# Patient Record
Sex: Male | Born: 1975 | Race: Black or African American | Hispanic: No | Marital: Married | Smoking: Current every day smoker
Health system: Southern US, Community
[De-identification: ages and names within clinical notes are randomized; demographics above are authoritative.]

## PROBLEM LIST (undated history)

## (undated) ENCOUNTER — Emergency Department (HOSPITAL_COMMUNITY): Discharge status: Against Medical Advice | Payer: Self-pay

## (undated) DIAGNOSIS — S79911A Unspecified injury of right hip, initial encounter: Secondary | ICD-10-CM

---

## 2000-04-09 ENCOUNTER — Emergency Department (HOSPITAL_COMMUNITY): Admission: EM | Admit: 2000-04-09 | Discharge: 2000-04-09 | Payer: Self-pay | Admitting: Emergency Medicine

## 2000-08-03 ENCOUNTER — Emergency Department (HOSPITAL_COMMUNITY): Admission: EM | Admit: 2000-08-03 | Discharge: 2000-08-03 | Payer: Self-pay

## 2000-12-01 ENCOUNTER — Emergency Department (HOSPITAL_COMMUNITY): Admission: EM | Admit: 2000-12-01 | Discharge: 2000-12-01 | Payer: Self-pay | Admitting: Emergency Medicine

## 2001-07-23 ENCOUNTER — Emergency Department (HOSPITAL_COMMUNITY): Admission: EM | Admit: 2001-07-23 | Discharge: 2001-07-23 | Payer: Self-pay | Admitting: Emergency Medicine

## 2001-07-23 ENCOUNTER — Encounter: Payer: Self-pay | Admitting: Emergency Medicine

## 2004-01-17 ENCOUNTER — Emergency Department (HOSPITAL_COMMUNITY): Admission: EM | Admit: 2004-01-17 | Discharge: 2004-01-17 | Payer: Self-pay | Admitting: Emergency Medicine

## 2004-09-01 ENCOUNTER — Emergency Department (HOSPITAL_COMMUNITY): Admission: EM | Admit: 2004-09-01 | Discharge: 2004-09-01 | Payer: Self-pay | Admitting: Emergency Medicine

## 2005-03-07 ENCOUNTER — Emergency Department (HOSPITAL_COMMUNITY): Admission: EM | Admit: 2005-03-07 | Discharge: 2005-03-07 | Payer: Self-pay | Admitting: Emergency Medicine

## 2007-02-15 ENCOUNTER — Emergency Department (HOSPITAL_COMMUNITY): Admission: EM | Admit: 2007-02-15 | Discharge: 2007-02-15 | Payer: Self-pay | Admitting: Emergency Medicine

## 2010-04-28 ENCOUNTER — Emergency Department (HOSPITAL_COMMUNITY): Admission: EM | Admit: 2010-04-28 | Discharge: 2010-04-28 | Payer: Self-pay | Admitting: Emergency Medicine

## 2010-11-11 ENCOUNTER — Emergency Department (HOSPITAL_COMMUNITY)
Admission: EM | Admit: 2010-11-11 | Discharge: 2010-11-12 | Payer: Self-pay | Source: Home / Self Care | Admitting: Emergency Medicine

## 2011-12-18 ENCOUNTER — Emergency Department (HOSPITAL_COMMUNITY)
Admission: EM | Admit: 2011-12-18 | Discharge: 2011-12-18 | Disposition: A | Discharge status: Home / Self Care | Payer: Self-pay | Attending: Emergency Medicine | Admitting: Emergency Medicine

## 2011-12-18 ENCOUNTER — Encounter (HOSPITAL_COMMUNITY): Payer: Self-pay | Admitting: *Deleted

## 2011-12-18 ENCOUNTER — Emergency Department (HOSPITAL_COMMUNITY): Payer: Self-pay

## 2011-12-18 DIAGNOSIS — W010XXA Fall on same level from slipping, tripping and stumbling without subsequent striking against object, initial encounter: Secondary | ICD-10-CM | POA: Insufficient documentation

## 2011-12-18 DIAGNOSIS — E876 Hypokalemia: Secondary | ICD-10-CM | POA: Insufficient documentation

## 2011-12-18 DIAGNOSIS — F17200 Nicotine dependence, unspecified, uncomplicated: Secondary | ICD-10-CM | POA: Insufficient documentation

## 2011-12-18 DIAGNOSIS — M7989 Other specified soft tissue disorders: Secondary | ICD-10-CM | POA: Insufficient documentation

## 2011-12-18 DIAGNOSIS — R609 Edema, unspecified: Secondary | ICD-10-CM | POA: Insufficient documentation

## 2011-12-18 DIAGNOSIS — IMO0002 Reserved for concepts with insufficient information to code with codable children: Secondary | ICD-10-CM | POA: Insufficient documentation

## 2011-12-18 DIAGNOSIS — S7010XA Contusion of unspecified thigh, initial encounter: Secondary | ICD-10-CM | POA: Insufficient documentation

## 2011-12-18 DIAGNOSIS — M79609 Pain in unspecified limb: Secondary | ICD-10-CM | POA: Insufficient documentation

## 2011-12-18 LAB — DIFFERENTIAL
Eosinophils Relative: 1 % (ref 0–5)
Lymphocytes Relative: 38 % (ref 12–46)
Lymphs Abs: 2.8 10*3/uL (ref 0.7–4.0)
Monocytes Absolute: 0.6 10*3/uL (ref 0.1–1.0)

## 2011-12-18 LAB — CBC
HCT: 36.2 % — ABNORMAL LOW (ref 39.0–52.0)
Hemoglobin: 12.6 g/dL — ABNORMAL LOW (ref 13.0–17.0)
MCV: 91.9 fL (ref 78.0–100.0)
Platelets: 133 10*3/uL — ABNORMAL LOW (ref 150–400)
RBC: 3.94 MIL/uL — ABNORMAL LOW (ref 4.22–5.81)
WBC: 7.4 10*3/uL (ref 4.0–10.5)

## 2011-12-18 LAB — BASIC METABOLIC PANEL
CO2: 25 mEq/L (ref 19–32)
Calcium: 9.7 mg/dL (ref 8.4–10.5)
Glucose, Bld: 131 mg/dL — ABNORMAL HIGH (ref 70–99)
Sodium: 140 mEq/L (ref 135–145)

## 2011-12-18 MED ORDER — FENTANYL CITRATE 0.05 MG/ML IJ SOLN
50.0000 ug | Freq: Once | INTRAMUSCULAR | Status: AC
  Administered 2011-12-18: 50 ug via INTRAVENOUS
  Filled 2011-12-18: qty 2

## 2011-12-18 MED ORDER — SODIUM CHLORIDE 0.9 % IV BOLUS (SEPSIS)
1000.0000 mL | Freq: Once | INTRAVENOUS | Status: AC
  Administered 2011-12-18: 1000 mL via INTRAVENOUS

## 2011-12-18 MED ORDER — POTASSIUM CHLORIDE CRYS ER 20 MEQ PO TBCR
40.0000 meq | EXTENDED_RELEASE_TABLET | Freq: Once | ORAL | Status: AC
  Administered 2011-12-18: 40 meq via ORAL
  Filled 2011-12-18: qty 2

## 2011-12-18 MED ORDER — OXYCODONE-ACETAMINOPHEN 5-325 MG PO TABS: ORAL_TABLET | ORAL | Status: AC

## 2011-12-18 MED ORDER — ONDANSETRON HCL 4 MG/2ML IJ SOLN
4.0000 mg | Freq: Once | INTRAMUSCULAR | Status: AC
  Administered 2011-12-18: 4 mg via INTRAVENOUS
  Filled 2011-12-18: qty 2

## 2011-12-18 MED ORDER — POTASSIUM CHLORIDE ER 10 MEQ PO TBCR: 10.0000 meq | EXTENDED_RELEASE_TABLET | Freq: Two times a day (BID) | ORAL | Status: AC

## 2011-12-18 NOTE — ED Notes (Signed)
Pt states "was going to move to some stuff in a storage building, slipped & fell, hit my leg on the concrete"; pt presents with hematoma & abrasion to left lateral thigh

## 2011-12-18 NOTE — ED Provider Notes (Signed)
History     CSN: [id number]  Arrival date & time 12/18/11  1609   First MD Initiated Contact with Patient 12/18/11 1904      Chief Complaint  Patient presents with  . Leg Injury    (Consider location/radiation/quality/duration/timing/severity/associated sxs/prior treatment) HPI  Patient presents to emergency department complaining of left lateral upper thigh injury and pain that began earlier today. Patient states that he was standing on a concrete floor and slipped from a standing position causing him to fall plan on the lateral aspect of his left upper thigh. Patient states that he had some immediate pain but as the day progressed had increasing swelling and bruising as well as pain over the area of his thigh that he landed on. Patient denies hitting head, loss of consciousness, or additional injury. Patient states that angulate he has become more painful as the swelling has progressed. Patient states he has no known medical problems and takes no medicine on regular basis. He specifically denies any personal history of bleeding or clotting disorders in any known family history of bleeding or clotting disorders. Patient will use an occasional BC headache powder as needed but takes no daily anticoagulants. Patient denies any numbness or tingling in his left lower extremity. Pain is aggravated by touch and movement of his thigh. Patient has not taken anything for pain prior to arrival. Pain is constant with pain increasing with aggravation. Patient states he has been awake since 4 AM which is the time that he has to wake up for work and has not eaten very much today and states the increasing pain is causing him to feel nauseous and lightheaded.  History reviewed. No pertinent past medical history.  History reviewed. No pertinent past surgical history.  No family history on file.  History  Substance Use Topics  . Smoking status: Current Everyday Smoker -- 0.5 packs/day  . Smokeless tobacco:  Not on file  . Alcohol Use: 3.6 oz/week    6 Cans of beer per week      Review of Systems  All other systems reviewed and are negative.    Allergies  Review of patient's allergies indicates no known allergies.  Home Medications   Current Outpatient Rx  Name Route Sig Dispense Refill  . BC HEADACHE POWDER PO Oral Take 1 packet by mouth 2 (two) times daily as needed. For pain.      Ht 6' (1.829 m)  Wt 185 lb (83.915 kg)  BMI 25.09 kg/m2  Physical Exam  Nursing note and vitals reviewed. Constitutional: He is oriented to person, place, and time. He appears well-developed and well-nourished. No distress.  HENT:  Head: Normocephalic and atraumatic.  Eyes: Conjunctivae and EOM are normal. Pupils are equal, round, and reactive to light.  Neck: Normal range of motion. Neck supple.  Cardiovascular: Normal rate, regular rhythm, normal heart sounds and intact distal pulses.  Exam reveals no gallop and no friction rub.   No murmur heard. Pulmonary/Chest: Effort normal and breath sounds normal. No respiratory distress. He has no wheezes. He has no rales. He exhibits no tenderness.  Abdominal: Soft. Bowel sounds are normal. He exhibits no distension and no mass. There is no tenderness. There is no rebound and no guarding.  Musculoskeletal: Normal range of motion. He exhibits edema and tenderness.       Large hematoma of left lateral upper thigh with bruising and firmness. Moderate tenderness to palpation. Pelvis is stable and nontender. Bilateral knees and ankles are nontender with  full range of motion. Pain is thigh with range of motion of left knee. Abrasion over hematoma but hemostatic.  Neurological: He is alert and oriented to person, place, and time.  Skin: Skin is warm and dry. No rash noted. He is not diaphoretic. No erythema.  Psychiatric: He has a normal mood and affect.    ED Course  Procedures (including critical care time)  IV fluids, IV fentanyl, and IV  Zofran.  Patient states he feels much better after medications.   Labs Reviewed  CBC - Abnormal; Notable for the following:    RBC 3.94 (*)    Hemoglobin 12.6 (*)    HCT 36.2 (*)    Platelets 133 (*)    All other components within normal limits  BASIC METABOLIC PANEL - Abnormal; Notable for the following:    Potassium 2.9 (*)    Glucose, Bld 131 (*)    All other components within normal limits  DIFFERENTIAL   Dg Femur Left  12/18/2011  *RADIOLOGY REPORT*  Clinical Data: Larey Seat, pain and swelling.  LEFT FEMUR - 2 VIEW  Comparison: None.  Findings: There is marked soft tissue swelling lateral to the mid shaft of the femur. Negative for fracture, dislocation, or other acute abnormality.  Normal alignment and mineralization. No significant degenerative change.  IMPRESSION:  Lateral soft tissue swelling without bony abnormality.  Original Report Authenticated By: Thora Lance III, M.D.     1. Hematoma of thigh   2. Hypokalemia       MDM  Given the size of the large hematoma we have requested that the patient return on Sunday for recheck. Baseline labs were drawn so that a baseline hemoglobin can be established to compare at follow up if needed. Patient's pain has improved with IV pain medication. His left lower extremity is neurovascularly intact. Patient denies any history of bleeding or clotting disorders and takes no anticoagulants a regular basis. Patient denies additional injury. Patient is agreeable to returning on Sunday but returning sooner changing or worsening symptoms.        Jenness Corner, [place] 12/19/11 [phone]

## 2011-12-18 NOTE — ED Notes (Signed)
Per EMS pt fell in garage and has hematoma left lateral leg. Pt able to ambulate. No LOC or head injury.

## 2011-12-18 NOTE — ED Notes (Addendum)
Meds given as ordered--Rates pain prior to meds administration as a 9 on 1-10 scale.  Labs collected and sent to lab

## 2011-12-19 NOTE — ED Provider Notes (Signed)
Medical screening examination/treatment/procedure(s) were performed by non-physician practitioner and as supervising physician I was immediately available for consultation/collaboration.   Nehal Witting A. Krystiana Fornes, MD 12/19/11 0932 

## 2011-12-21 ENCOUNTER — Emergency Department (HOSPITAL_COMMUNITY)
Admission: EM | Admit: 2011-12-21 | Discharge: 2011-12-21 | Disposition: A | Discharge status: Home / Self Care | Payer: Self-pay | Attending: Emergency Medicine | Admitting: Emergency Medicine

## 2011-12-21 ENCOUNTER — Encounter (HOSPITAL_COMMUNITY): Payer: Self-pay | Admitting: Emergency Medicine

## 2011-12-21 DIAGNOSIS — S7010XA Contusion of unspecified thigh, initial encounter: Secondary | ICD-10-CM | POA: Insufficient documentation

## 2011-12-21 DIAGNOSIS — W11XXXA Fall on and from ladder, initial encounter: Secondary | ICD-10-CM | POA: Insufficient documentation

## 2011-12-21 DIAGNOSIS — Y92009 Unspecified place in unspecified non-institutional (private) residence as the place of occurrence of the external cause: Secondary | ICD-10-CM | POA: Insufficient documentation

## 2011-12-21 DIAGNOSIS — M7989 Other specified soft tissue disorders: Secondary | ICD-10-CM | POA: Insufficient documentation

## 2011-12-21 DIAGNOSIS — R609 Edema, unspecified: Secondary | ICD-10-CM | POA: Insufficient documentation

## 2011-12-21 DIAGNOSIS — F17200 Nicotine dependence, unspecified, uncomplicated: Secondary | ICD-10-CM | POA: Insufficient documentation

## 2011-12-21 DIAGNOSIS — M79609 Pain in unspecified limb: Secondary | ICD-10-CM | POA: Insufficient documentation

## 2011-12-21 MED ORDER — OXYCODONE-ACETAMINOPHEN 5-325 MG PO TABS: 1.0000 | ORAL_TABLET | ORAL | Status: AC | PRN

## 2011-12-21 MED ORDER — IBUPROFEN 800 MG PO TABS: 800.0000 mg | ORAL_TABLET | Freq: Three times a day (TID) | ORAL | Status: AC

## 2011-12-21 NOTE — ED Notes (Signed)
Large raised, bruised area on outer l/thigh, bruising on inner l/thigh

## 2011-12-21 NOTE — ED Provider Notes (Signed)
History     CSN: [id number]  Arrival date & time 12/21/11  1455   First MD Initiated Contact with Patient 12/21/11 1525     3:56 PM HPI Patient reports he was seen here on Thursday after falling off of a ladder. Reports a left thigh injury. Was advised to return to the ED today for a recheck. Reports no improvement or worsening of the pain. Denies numbness, tingling or weakness. Denies CP or SOB. Requests an extended work note.  Patient is a 36 y.o. male presenting with leg pain. The history is provided by the patient.  Leg Pain  The incident occurred more than 2 days ago. The incident occurred at home. The injury mechanism was a fall. The pain is present in the left thigh. The quality of the pain is described as aching. The pain is severe. The pain has been constant since onset. Pertinent negatives include no numbness, no inability to bear weight, no loss of motion, no muscle weakness, no loss of sensation and no tingling. He reports no foreign bodies present. The symptoms are aggravated by activity, bearing weight and palpation. He has tried elevation, ice and rest (Percocet) for the symptoms. The treatment provided moderate relief.    History reviewed. No pertinent past medical history.  History reviewed. No pertinent past surgical history.  History reviewed. No pertinent family history.  History  Substance Use Topics  . Smoking status: Current Everyday Smoker -- 0.5 packs/day  . Smokeless tobacco: Not on file  . Alcohol Use: 3.6 oz/week    6 Cans of beer per week      Review of Systems  Constitutional: Negative for fever and chills.  Respiratory: Negative for shortness of breath.   Cardiovascular: Negative for chest pain.  Gastrointestinal: Negative for nausea and vomiting.  Musculoskeletal:       Leg pain  Skin:       Hematoma  Neurological: Negative for tingling and numbness.    Allergies  Review of patient's allergies indicates no known allergies.  Home  Medications   Current Outpatient Rx  Name Route Sig Dispense Refill  . BC HEADACHE POWDER PO Oral Take 1 packet by mouth 2 (two) times daily as needed. For pain.    . OXYCODONE-ACETAMINOPHEN 5-325 MG PO TABS  Take 1-2 tabs every 4 hours as needed for pain. 15 tablet 0  . POTASSIUM CHLORIDE ER 10 MEQ PO TBCR Oral Take 1 tablet (10 mEq total) by mouth 2 (two) times daily. 30 tablet 0    BP 126/60  Pulse 92  Temp(Src) 98 F (36.7 C) (Oral)  Resp 18  SpO2 100%  Physical Exam  Constitutional: He is oriented to person, place, and time. He appears well-developed and well-nourished.  HENT:  Head: Normocephalic and atraumatic.  Eyes: Pupils are equal, round, and reactive to light.  Musculoskeletal:       Left upper leg: He exhibits tenderness, swelling and edema.       Legs: Neurological: He is alert and oriented to person, place, and time.  Skin: Skin is warm and dry. No rash noted. No erythema. No pallor.  Psychiatric: He has a normal mood and affect. His behavior is normal.    ED Course  Procedures   MDM   Advised patient to return immediately for CP, SOB, fever or worsening pain. Recommended warm compresses , rest and elevation. Will give another Rx for percocet and a new Rx for ibuprofen. Patient agrees with plan and is ready for discharge.  Thomasene Lot, PA-C 12/21/11 1609

## 2011-12-22 NOTE — ED Provider Notes (Signed)
Medical screening examination/treatment/procedure(s) were performed by non-physician practitioner and as supervising physician I was immediately available for consultation/collaboration.   Mettie Roylance, MD 12/22/11 0647 

## 2012-01-10 ENCOUNTER — Emergency Department (HOSPITAL_COMMUNITY)
Admission: EM | Admit: 2012-01-10 | Discharge: 2012-01-10 | Disposition: A | Discharge status: Home / Self Care | Payer: Self-pay | Attending: Emergency Medicine | Admitting: Emergency Medicine

## 2012-01-10 ENCOUNTER — Emergency Department (HOSPITAL_COMMUNITY): Payer: Self-pay

## 2012-01-10 ENCOUNTER — Encounter (HOSPITAL_COMMUNITY): Payer: Self-pay | Admitting: *Deleted

## 2012-01-10 DIAGNOSIS — S7010XA Contusion of unspecified thigh, initial encounter: Secondary | ICD-10-CM | POA: Insufficient documentation

## 2012-01-10 DIAGNOSIS — M25559 Pain in unspecified hip: Secondary | ICD-10-CM | POA: Insufficient documentation

## 2012-01-10 DIAGNOSIS — S8010XA Contusion of unspecified lower leg, initial encounter: Secondary | ICD-10-CM

## 2012-01-10 DIAGNOSIS — W2209XA Striking against other stationary object, initial encounter: Secondary | ICD-10-CM | POA: Insufficient documentation

## 2012-01-10 HISTORY — DX: Unspecified injury of right hip, initial encounter: S79.911A

## 2012-01-10 LAB — CBC
MCH: 33 pg (ref 26.0–34.0)
MCV: 94.9 fL (ref 78.0–100.0)
Platelets: 223 10*3/uL (ref 150–400)
RBC: 4.33 MIL/uL (ref 4.22–5.81)

## 2012-01-10 LAB — POCT I-STAT, CHEM 8
BUN: 8 mg/dL (ref 6–23)
Chloride: 108 mEq/L (ref 96–112)
HCT: 45 % (ref 39.0–52.0)
Potassium: 4.1 mEq/L (ref 3.5–5.1)
Sodium: 143 mEq/L (ref 135–145)

## 2012-01-10 LAB — DIFFERENTIAL
Eosinophils Absolute: 0.1 10*3/uL (ref 0.0–0.7)
Eosinophils Relative: 2 % (ref 0–5)
Lymphs Abs: 2.4 10*3/uL (ref 0.7–4.0)
Monocytes Absolute: 0.5 10*3/uL (ref 0.1–1.0)
Monocytes Relative: 8 % (ref 3–12)

## 2012-01-10 MED ORDER — OXYCODONE-ACETAMINOPHEN 5-325 MG PO TABS: 1.0000 | ORAL_TABLET | ORAL | Status: AC | PRN

## 2012-01-10 NOTE — ED Provider Notes (Signed)
History     CSN: [id number]  Arrival date & time 01/10/12  1649   First MD Initiated Contact with Patient 01/10/12 1721      Chief Complaint  Patient presents with  . Hip Pain    bleeding    (Consider location/radiation/quality/duration/timing/severity/associated sxs/prior treatment) HPI Comments: Patient reports that he fell off of a ladder on 12/18/11.  Patient had negative xrays done at that time.  He had a large hematoma of his left thigh and pain after the injury.  He reports that the swelling has improved.  However, he continues to have significant pain and swelling of that area.  Today he hit this area on the corner of a table and has been having some bleeding from the area. Bleeding controlled at this time. He reports that the area was scabbed over.  He denies any numbness or tingling.  Denies any fever or chills.He denies any prior bleeding disorders.  He reports that he is not on any anticoagulation therapy.  Patient is a 36 y.o. male presenting with hip pain. The history is provided by the patient.  Hip Pain Pertinent negatives include no chills, fever, nausea, numbness or vomiting.    Past Medical History  Diagnosis Date  . Injury of right hip     History reviewed. No pertinent past surgical history.  History reviewed. No pertinent family history.  History  Substance Use Topics  . Smoking status: Current Everyday Smoker -- 0.5 packs/day    Types: Cigarettes  . Smokeless tobacco: Never Used  . Alcohol Use: 3.6 oz/week    6 Cans of beer per week      Review of Systems  Constitutional: Negative for fever and chills.  Gastrointestinal: Negative for nausea and vomiting.  Musculoskeletal: Negative for gait problem.  Skin: Positive for wound.  Neurological: Negative for dizziness, syncope, light-headedness and numbness.    Allergies  Review of patient's allergies indicates no known allergies.  Home Medications   Current Outpatient Rx  Name Route Sig Dispense  Refill  . OXYCODONE-ACETAMINOPHEN 5-325 MG PO TABS Oral Take 1 tablet by mouth every 4 (four) hours as needed. For pain relief    . POTASSIUM CHLORIDE ER 10 MEQ PO TBCR Oral Take 1 tablet (10 mEq total) by mouth 2 (two) times daily. 30 tablet 0    BP 145/95  Pulse 94  Temp(Src) 98.6 F (37 C) (Oral)  Resp 18  Wt 186 lb (84.369 kg)  SpO2 98%  Physical Exam  Nursing note and vitals reviewed. Constitutional: He is oriented to person, place, and time. He appears well-developed and well-nourished. No distress.  HENT:  Head: Normocephalic and atraumatic.  Neck: Normal range of motion. Neck supple.  Cardiovascular: Normal rate, regular rhythm and normal heart sounds.   Pulmonary/Chest: Effort normal and breath sounds normal.  Musculoskeletal: Normal range of motion.       Large 5-6 cm in diameter hematoma of the left lateral thigh.  Area scabbed in the center.  Area not actively bleeding.  Mild tenderness to palpation. Full ROM of left hip, knee, and ankle. Patient able to ambulate without difficulty. Dorsal pedis pulse 2+ bilaterally. Distal sensation intact.   Neurological: He is alert and oriented to person, place, and time.  Skin: He is not diaphoretic.     Psychiatric: He has a normal mood and affect.    ED Course  Procedures (including critical care time)  Labs Reviewed - No data to display No results found.   No  diagnosis found.  Patient discussed with Dr. Manus Gunning who also evaluated patient.  MDM  Patient with large hematoma of the thigh.  CT scan was ordered of the hip, which was negative for fracture.  Patient neurovascularly intact.  Patient instructed to follow up with Orthopedics.          Pascal [place], PA-C 01/11/12 [phone]

## 2012-01-10 NOTE — ED Notes (Signed)
MD at bedside. 

## 2012-01-10 NOTE — ED Notes (Signed)
Patient returned from CT

## 2012-01-10 NOTE — ED Notes (Signed)
Pt from home with reports of hitting corner of a table with right hip that is injured after fall from ladder on 12/18/11. Pt reports area began to bleed again with increase in pain, area already swollen from previous injury, pt reports xray was done and was negative.

## 2012-01-10 NOTE — Discharge Instructions (Signed)
Hematoma You have a hematoma. This is an accumulation of blood usually caused by a blunt injury with a deep tissue bruise. Hematomas may form in large contusions and stay painful and swollen for weeks. Hematomas are usually reabsorbed by the body naturally, although at times they need to be drained. Initial treatment of contusions and hematomas includes:  Rest the injured area until the pain and swelling are better.   Apply ice packs for 20-30 minutes every 3-4 hours for the first 2-3 days.   Keep the injury elevated as much as possible to help reduce swelling and pain.   Compression bandages may also be used to assist with swelling and help restrict motion.  Most hematomas do not cause complications. They improve over several weeks as the blood is broken down and removed from the tissues. The area of injury may look bruised for several weeks. Reduce your activities until your injury is improved. Hematomas sometimes become infected.  SEEK MEDICAL CARE IF:   Your injury shows signs of infection with increased redness, swelling, or pain.   Your limb becomes cold, numb, or blue if the hematoma is in an upper or lower extremity.   You develop an unexplained fever.   You have any other questions or concerns.  Document Released: 12/04/2004 Document Revised: 06/25/2011 Document Reviewed: 10/27/2005 [facility] Patient Information 2012 [place], [place].  Your pain medication contains Tylenol.  Do not take additional Tylenol with this medication.  Do not drive or operate heavy machinery while taking this medication.

## 2012-01-10 NOTE — ED Notes (Signed)
PA at bedside.

## 2012-01-10 NOTE — ED Notes (Signed)
Patient transported to CT 

## 2012-01-11 NOTE — ED Provider Notes (Signed)
Medical screening examination/treatment/procedure(s) were conducted as a shared visit with non-physician practitioner(s) and myself.  I personally evaluated the patient during the encounter  L lateral thigh hematoma since fall on 2/7.  Scant bleeding from minor trauma today.  NVI. Large lateral thigh hematoma, hemostatic.  Glynn Octave, MD 01/11/12 [phone]

## 2012-01-19 ENCOUNTER — Encounter (HOSPITAL_COMMUNITY): Payer: Self-pay | Admitting: Emergency Medicine

## 2012-01-19 ENCOUNTER — Emergency Department (HOSPITAL_COMMUNITY)
Admission: EM | Admit: 2012-01-19 | Discharge: 2012-01-19 | Disposition: A | Discharge status: Home / Self Care | Payer: Self-pay | Attending: Emergency Medicine | Admitting: Emergency Medicine

## 2012-01-19 ENCOUNTER — Emergency Department (HOSPITAL_COMMUNITY): Payer: Self-pay

## 2012-01-19 DIAGNOSIS — S71009A Unspecified open wound, unspecified hip, initial encounter: Secondary | ICD-10-CM | POA: Insufficient documentation

## 2012-01-19 DIAGNOSIS — W11XXXA Fall on and from ladder, initial encounter: Secondary | ICD-10-CM | POA: Insufficient documentation

## 2012-01-19 DIAGNOSIS — M79609 Pain in unspecified limb: Secondary | ICD-10-CM | POA: Insufficient documentation

## 2012-01-19 DIAGNOSIS — F17200 Nicotine dependence, unspecified, uncomplicated: Secondary | ICD-10-CM | POA: Insufficient documentation

## 2012-01-19 DIAGNOSIS — S71109A Unspecified open wound, unspecified thigh, initial encounter: Secondary | ICD-10-CM

## 2012-01-19 LAB — CBC
HCT: 36.7 % — ABNORMAL LOW (ref 39.0–52.0)
Hemoglobin: 12.6 g/dL — ABNORMAL LOW (ref 13.0–17.0)
MCV: 95.3 fL (ref 78.0–100.0)
RBC: 3.85 MIL/uL — ABNORMAL LOW (ref 4.22–5.81)
RDW: 14.3 % (ref 11.5–15.5)
WBC: 7.6 10*3/uL (ref 4.0–10.5)

## 2012-01-19 LAB — DIFFERENTIAL
Basophils Absolute: 0.1 10*3/uL (ref 0.0–0.1)
Basophils Relative: 1 % (ref 0–1)
Eosinophils Relative: 2 % (ref 0–5)
Lymphocytes Relative: 34 % (ref 12–46)
Monocytes Relative: 11 % (ref 3–12)
Neutro Abs: 3.9 10*3/uL (ref 1.7–7.7)

## 2012-01-19 LAB — BASIC METABOLIC PANEL
BUN: 8 mg/dL (ref 6–23)
CO2: 26 mEq/L (ref 19–32)
Chloride: 100 mEq/L (ref 96–112)
GFR calc Af Amer: >90 mL/min (ref >90)
Potassium: 3.8 mEq/L (ref 3.5–5.1)

## 2012-01-19 MED ORDER — SODIUM CHLORIDE 0.9 % IV BOLUS (SEPSIS)
1000.0000 mL | Freq: Once | INTRAVENOUS | Status: AC
  Administered 2012-01-19: 1000 mL via INTRAVENOUS

## 2012-01-19 MED ORDER — ONDANSETRON HCL 4 MG/2ML IJ SOLN
4.0000 mg | Freq: Once | INTRAMUSCULAR | Status: AC
Start: 2012-01-19 — End: 2012-01-19
  Administered 2012-01-19: 4 mg via INTRAVENOUS
  Filled 2012-01-19: qty 2

## 2012-01-19 MED ORDER — VANCOMYCIN HCL IN DEXTROSE 1-5 GM/200ML-% IV SOLN
1000.0000 mg | Freq: Once | INTRAVENOUS | Status: AC
  Administered 2012-01-19: 1000 mg via INTRAVENOUS
  Filled 2012-01-19: qty 200

## 2012-01-19 MED ORDER — PIPERACILLIN-TAZOBACTAM 3.375 G IVPB
3.3750 g | Freq: Once | INTRAVENOUS | Status: AC
  Administered 2012-01-19: 3.375 g via INTRAVENOUS
  Filled 2012-01-19: qty 50

## 2012-01-19 MED ORDER — OXYCODONE-ACETAMINOPHEN 5-325 MG PO TABS
2.0000 | ORAL_TABLET | Freq: Once | ORAL | Status: AC
  Administered 2012-01-19: 2 via ORAL
  Filled 2012-01-19: qty 2

## 2012-01-19 NOTE — ED Notes (Signed)
Pt seen here 3 days ago dx with hematoma left leg. On Sunday hit it on the end of a table and puncture the site and it bleed. Today site is still bleeding and pt is concerned about infection.

## 2012-01-19 NOTE — Consult Note (Signed)
Reason for Consult:  Chronic Left Thigh Wound over previous Hematoma Referring Physician:   Rosalia Hammers, MD  Antonio Erickson is an 36 y.o. male.  HPI:   36 yo male s/p injury to left thigh 3 weeks ago.  Was seen by ER staff and appropriately treated for a deep hematoma.  Was given ortho follow-up with another group, but circumstances did not allow him to follow-up.  He then developed a scab over his lateral thigh which came off.  He girlfriend has been keeping it clean.  They came back to the ER today for further follow-up.  I was consulted to treat the now chronic wound.  Past Medical History  Diagnosis Date  . Injury of right hip     History reviewed. No pertinent past surgical history.  History reviewed. No pertinent family history.  Social History:  reports that he has been smoking Cigarettes.  He has been smoking about .5 packs per day. He has never used smokeless tobacco. He reports that he drinks about 3.6 ounces of alcohol per week. He reports that he does not use illicit drugs.  Allergies: No Known Allergies  Medications: I have reviewed the patient's current medications.  Results for orders placed during the hospital encounter of 01/19/12 (from the past 48 hour(s))  CBC     Status: Abnormal   Collection Time   01/19/12  6:25 PM      Component Value Range Comment   WBC 7.6  4.0 - 10.5 (K/uL)    RBC 3.85 (*) 4.22 - 5.81 (MIL/uL)    Hemoglobin 12.6 (*) 13.0 - 17.0 (g/dL)    HCT 84.6 (*) 96.2 - 52.0 (%)    MCV 95.3  78.0 - 100.0 (fL)    MCH 32.7  26.0 - 34.0 (pg)    MCHC 34.3  30.0 - 36.0 (g/dL)    RDW 95.2  84.1 - 32.4 (%)    Platelets 185  150 - 400 (K/uL)   DIFFERENTIAL     Status: Normal   Collection Time   01/19/12  6:25 PM      Component Value Range Comment   Neutrophils Relative 52  43 - 77 (%)    Lymphocytes Relative 34  12 - 46 (%)    Monocytes Relative 11  3 - 12 (%)    Eosinophils Relative 2  0 - 5 (%)    Basophils Relative 1  0 - 1 (%)    Neutro Abs 3.9  1.7 - 7.7  (K/uL)    Lymphs Abs 2.6  0.7 - 4.0 (K/uL)    Monocytes Absolute 0.8  0.1 - 1.0 (K/uL)    Eosinophils Absolute 0.2  0.0 - 0.7 (K/uL)    Basophils Absolute 0.1  0.0 - 0.1 (K/uL)    Smear Review MORPHOLOGY UNREMARKABLE     BASIC METABOLIC PANEL     Status: Abnormal   Collection Time   01/19/12  6:25 PM      Component Value Range Comment   Sodium 138  135 - 145 (mEq/L)    Potassium 3.8  3.5 - 5.1 (mEq/L)    Chloride 100  96 - 112 (mEq/L)    CO2 26  19 - 32 (mEq/L)    Glucose, Bld 104 (*) 70 - 99 (mg/dL)    BUN 8  6 - 23 (mg/dL)    Creatinine, Ser 4.01  0.50 - 1.35 (mg/dL)    Calcium 9.2  8.4 - 10.5 (mg/dL)    GFR calc  non Af Amer >90  >90 (mL/min)    GFR calc Af Amer >90  >90 (mL/min)   CK     Status: Abnormal   Collection Time   01/19/12  6:25 PM      Component Value Range Comment   Total CK 236 (*) 7 - 232 (U/L)     Dg Femur Left  01/19/2012  *RADIOLOGY REPORT*  Clinical Data: Open wound  LEFT FEMUR - 2 VIEW  Comparison: None.  Findings: Four views of the left femur submitted.  No acute fracture or subluxation.  No periosteal reaction or bony erosion. There is a skin defect and soft tissue irregularity in the mid aspect of the thigh with adjacent soft tissue swelling.  Clinical correlation is necessary.  Further evaluation with MRI could be performed to exclude cellulitis or myositis.  IMPRESSION:  No acute fracture or subluxation.  No periosteal reaction or bony erosion.  There is a skin defect and soft tissue irregularity in the mid aspect of the thigh with adjacent soft tissue swelling. Clinical correlation is necessary.  Further evaluation with MRI could be performed to exclude cellulitis or myositis.  Original Report Authenticated By: Natasha Mead, M.D.    Review of Systems  All other systems reviewed and are negative.   Blood pressure 133/79, pulse 89, temperature 99 F (37.2 C), temperature source Oral, SpO2 99.00%. Physical Exam  Musculoskeletal:        Legs:   Assessment/Plan: Chronic but clean left thigh wound 1) this will take a long time to heal fully.  He needs to soak in a bathtub daily in dial soapy water for 15-20 minutes followed by wet-to-dry dressing changes.  I'll see him myself in follow-up in 2 days in the office.  Kathryne Hitch 01/19/2012, 7:57 PM

## 2012-01-19 NOTE — Discharge Instructions (Signed)
Follow up with Dr. Rayburn Ma as he scheduled.

## 2012-01-19 NOTE — ED Provider Notes (Signed)
History     CSN: [id number]  Arrival date & time 01/19/12  1605   First MD Initiated Contact with Patient 01/19/12 1759      Chief Complaint  Patient presents with  . Open Wound    pt fell 3 weeks ago and had hematoma to left leg, pt now has open draining wound to left leg. pt c/o increased pain to leg.     (Consider location/radiation/quality/duration/timing/severity/associated sxs/prior treatment) HPI Patient fell off ladder onto left hip about one month ago. He was seen and had x-Avantae Bither done that did not show any fracture. He continued to have severe swelling and pain. He was seen here again and had a CT scan that showed a large hematoma. He had an abrasion on the lateral left thigh over the hematoma. This formed a scab. This came off on Saturday and there was a large hole with lots of clots coming from it. He is continued to have pain and has some redness in that area. He had nausea and vomiting. Past Medical History  Diagnosis Date  . Injury of right hip     History reviewed. No pertinent past surgical history.  History reviewed. No pertinent family history.  History  Substance Use Topics  . Smoking status: Current Everyday Smoker -- 0.5 packs/day    Types: Cigarettes  . Smokeless tobacco: Never Used  . Alcohol Use: 3.6 oz/week    6 Cans of beer per week      Review of Systems  All other systems reviewed and are negative.    Allergies  Review of patient's allergies indicates no known allergies.  Home Medications   Current Outpatient Rx  Name Route Sig Dispense Refill  . OXYCODONE-ACETAMINOPHEN 5-325 MG PO TABS Oral Take 1 tablet by mouth every 4 (four) hours as needed. For pain relief    . OXYCODONE-ACETAMINOPHEN 5-325 MG PO TABS Oral Take 1 tablet by mouth every 4 (four) hours as needed for pain. 15 tablet 0  . POTASSIUM CHLORIDE ER 10 MEQ PO TBCR Oral Take 1 tablet (10 mEq total) by mouth 2 (two) times daily. 30 tablet 0    BP 133/79  Pulse 89  Temp(Src)  99 F (37.2 C) (Oral)  SpO2 99%  Physical Exam  Constitutional: He is oriented to person, place, and time. He appears well-developed and well-nourished.  HENT:  Head: Normocephalic and atraumatic.  Eyes: Conjunctivae and EOM are normal. Pupils are equal, round, and reactive to light.  Neck: Normal range of motion. Neck supple.  Cardiovascular: Normal rate and regular rhythm.   Pulmonary/Chest: Effort normal and breath sounds normal.  Abdominal: Soft. Bowel sounds are normal.  Musculoskeletal:       Left thigh with 4x4 cm tissue defect lateral aspect with continued underlying hematoma and surrounding swelling with tenderness and induration, some erythema medial aspect.   Neurological: He is alert and oriented to person, place, and time.  Skin: Skin is warm and dry.  Psychiatric: He has a normal mood and affect.    ED Course  Procedures (including critical care time)   Labs Reviewed  CBC  DIFFERENTIAL  BASIC METABOLIC PANEL  CK   No results found.   No diagnosis found.  Results for orders placed during the hospital encounter of 01/19/12  CBC      Component Value Range   WBC 7.6  4.0 - 10.5 (K/uL)   RBC 3.85 (*) 4.22 - 5.81 (MIL/uL)   Hemoglobin 12.6 (*) 13.0 - 17.0 (g/dL)  HCT 36.7 (*) 39.0 - 52.0 (%)   MCV 95.3  78.0 - 100.0 (fL)   MCH 32.7  26.0 - 34.0 (pg)   MCHC 34.3  30.0 - 36.0 (g/dL)   RDW 16.1  09.6 - 04.5 (%)   Platelets 185  150 - 400 (K/uL)  DIFFERENTIAL      Component Value Range   Neutrophils Relative 52  43 - 77 (%)   Lymphocytes Relative 34  12 - 46 (%)   Monocytes Relative 11  3 - 12 (%)   Eosinophils Relative 2  0 - 5 (%)   Basophils Relative 1  0 - 1 (%)   Neutro Abs 3.9  1.7 - 7.7 (K/uL)   Lymphs Abs 2.6  0.7 - 4.0 (K/uL)   Monocytes Absolute 0.8  0.1 - 1.0 (K/uL)   Eosinophils Absolute 0.2  0.0 - 0.7 (K/uL)   Basophils Absolute 0.1  0.0 - 0.1 (K/uL)   Smear Review MORPHOLOGY UNREMARKABLE    BASIC METABOLIC PANEL      Component Value  Range   Sodium 138  135 - 145 (mEq/L)   Potassium 3.8  3.5 - 5.1 (mEq/L)   Chloride 100  96 - 112 (mEq/L)   CO2 26  19 - 32 (mEq/L)   Glucose, Bld 104 (*) 70 - 99 (mg/dL)   BUN 8  6 - 23 (mg/dL)   Creatinine, Ser 4.09  0.50 - 1.35 (mg/dL)   Calcium 9.2  8.4 - 81.1 (mg/dL)   GFR calc non Af Amer >90  >90 (mL/min)   GFR calc Af Amer >90  >90 (mL/min)  CK      Component Value Range   Total CK 236 (*) 7 - 232 (U/L)    MDM  I discussed the patient's care with Dr. Rayburn Ma. He advises Zosyn and vancomycin. Dr. Rayburn Ma saw and evaluated the patient here in the emergency Department. History and prescription for the patient. He advises wet to dry dressings and he has arranged followup with him.    Hilario Quarry, MD 01/20/12 [id number]

## 2012-01-21 NOTE — Progress Notes (Signed)
On 01/19/12 Pt listed as self pay with no insurance coverage Pt confirms he is self pay [place] resident CM and [facility] coordinator spoke with him Pt offered [facility] services to assist with finding a [place] self pay provider Pt accepted information    

## 2012-11-03 ENCOUNTER — Emergency Department (HOSPITAL_COMMUNITY)
Admission: EM | Admit: 2012-11-03 | Discharge: 2012-11-04 | Disposition: A | Discharge status: Home / Self Care | Payer: Self-pay | Attending: Emergency Medicine | Admitting: Emergency Medicine

## 2012-11-03 ENCOUNTER — Encounter (HOSPITAL_COMMUNITY): Payer: Self-pay | Admitting: Emergency Medicine

## 2012-11-03 DIAGNOSIS — F17200 Nicotine dependence, unspecified, uncomplicated: Secondary | ICD-10-CM | POA: Insufficient documentation

## 2012-11-03 DIAGNOSIS — K2970 Gastritis, unspecified, without bleeding: Secondary | ICD-10-CM | POA: Insufficient documentation

## 2012-11-03 DIAGNOSIS — Z87828 Personal history of other (healed) physical injury and trauma: Secondary | ICD-10-CM | POA: Insufficient documentation

## 2012-11-03 DIAGNOSIS — K920 Hematemesis: Secondary | ICD-10-CM | POA: Insufficient documentation

## 2012-11-03 DIAGNOSIS — S1093XA Contusion of unspecified part of neck, initial encounter: Secondary | ICD-10-CM | POA: Insufficient documentation

## 2012-11-03 DIAGNOSIS — R1013 Epigastric pain: Secondary | ICD-10-CM | POA: Insufficient documentation

## 2012-11-03 DIAGNOSIS — R112 Nausea with vomiting, unspecified: Secondary | ICD-10-CM

## 2012-11-03 DIAGNOSIS — S0003XA Contusion of scalp, initial encounter: Secondary | ICD-10-CM | POA: Insufficient documentation

## 2012-11-03 LAB — PROTIME-INR
INR: 0.98 (ref 0.00–1.49)
Prothrombin Time: 12.9 seconds (ref 11.6–15.2)

## 2012-11-03 LAB — CBC WITH DIFFERENTIAL/PLATELET
Hemoglobin: 15.9 g/dL (ref 13.0–17.0)
Lymphocytes Relative: 14 % (ref 12–46)
Lymphs Abs: 1.6 10*3/uL (ref 0.7–4.0)
MCH: 33 pg (ref 26.0–34.0)
Monocytes Relative: 5 % (ref 3–12)
Neutro Abs: 9.5 10*3/uL — ABNORMAL HIGH (ref 1.7–7.7)
Neutrophils Relative %: 80 % — ABNORMAL HIGH (ref 43–77)
RBC: 4.82 MIL/uL (ref 4.22–5.81)
WBC: 11.8 10*3/uL — ABNORMAL HIGH (ref 4.0–10.5)

## 2012-11-03 LAB — URINALYSIS, ROUTINE W REFLEX MICROSCOPIC
Glucose, UA: NEGATIVE mg/dL
Ketones, ur: 15 mg/dL — AB
Leukocytes, UA: NEGATIVE
Nitrite: NEGATIVE
Protein, ur: 30 mg/dL — AB

## 2012-11-03 LAB — COMPREHENSIVE METABOLIC PANEL
ALT: 41 U/L (ref 0–53)
Alkaline Phosphatase: 113 U/L (ref 39–117)
BUN: 16 mg/dL (ref 6–23)
CO2: 24 mEq/L (ref 19–32)
Chloride: 101 mEq/L (ref 96–112)
GFR calc Af Amer: >90 mL/min (ref >90)
Glucose, Bld: 121 mg/dL — ABNORMAL HIGH (ref 70–99)
Potassium: 3.8 mEq/L (ref 3.5–5.1)
Total Bilirubin: 1 mg/dL (ref 0.3–1.2)

## 2012-11-03 LAB — APTT: aPTT: 31 seconds (ref 24–37)

## 2012-11-03 LAB — URINE MICROSCOPIC-ADD ON

## 2012-11-03 LAB — TYPE AND SCREEN

## 2012-11-03 MED ORDER — ONDANSETRON HCL 4 MG/2ML IJ SOLN
4.0000 mg | Freq: Once | INTRAMUSCULAR | Status: DC
  Filled 2012-11-03: qty 2
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
## 2012-11-03 NOTE — ED Notes (Signed)
SBP 82-NS at WO rate per pump-pt remains alert and responsive-MP SR with rate 67-Dr. Rulon Abide at bedside to evaluate pt-pt denies any pain-BP 110/64 after NS bolus-pt currently cool and dry and answering Dr. Hoyt Koch questions.

## 2012-11-03 NOTE — ED Notes (Signed)
Pt presents to the ED via EMS with a complaint of Hematemesis.  Pt ate early in the day, went to sleep woke up nauseated and began to throw up.  Pt states" I threw up blood."

## 2012-11-03 NOTE — ED Notes (Signed)
Pt c/o nausea at this time.

## 2012-11-03 NOTE — ED Notes (Signed)
Pt arrived to Room 17 from triage-flat on stretcher chair-Tammy RN stated pt became syncopal when lab performing phlebotomy in triage-states pt had syncopal episode-pt is diaphoretic-connected to hardwire monitor-MP SB with rate 48-12 lead being completed-PIV inserted-rest of lab work obtained-pt is alert and states he became dizzy and felt faint while watching phlebotomist obtain his lab work-pt is diaphoretic-assisted to stretcher-clothing removed.

## 2012-11-04 MED ORDER — ONDANSETRON HCL 4 MG/2ML IJ SOLN
4.0000 mg | Freq: Once | INTRAMUSCULAR | Status: AC
  Administered 2012-11-04: 4 mg via INTRAVENOUS
  Filled 2012-11-04: qty 2

## 2012-11-04 MED ORDER — GI COCKTAIL ~~LOC~~
30.0000 mL | Freq: Once | ORAL | Status: AC
  Administered 2012-11-04: 30 mL via ORAL
  Filled 2012-11-04: qty 30

## 2012-11-04 NOTE — ED Notes (Signed)
Pt ambulated to check out window with steady gait. Family with pt

## 2013-05-16 ENCOUNTER — Emergency Department (HOSPITAL_COMMUNITY)
Admission: EM | Admit: 2013-05-16 | Discharge: 2013-05-16 | Disposition: A | Discharge status: Home / Self Care | Payer: Self-pay | Attending: Emergency Medicine | Admitting: Emergency Medicine

## 2013-05-16 ENCOUNTER — Encounter (HOSPITAL_COMMUNITY): Payer: Self-pay | Admitting: Emergency Medicine

## 2013-05-16 DIAGNOSIS — L255 Unspecified contact dermatitis due to plants, except food: Secondary | ICD-10-CM | POA: Insufficient documentation

## 2013-05-16 DIAGNOSIS — F17200 Nicotine dependence, unspecified, uncomplicated: Secondary | ICD-10-CM | POA: Insufficient documentation

## 2013-05-16 DIAGNOSIS — Z791 Long term (current) use of non-steroidal anti-inflammatories (NSAID): Secondary | ICD-10-CM | POA: Insufficient documentation

## 2013-05-16 MED ORDER — PREDNISONE 10 MG PO TABS: ORAL_TABLET | ORAL | Status: DC

## 2013-05-16 NOTE — Progress Notes (Signed)
[facility] has seen patient and provided him with a [employer].

## 2013-05-16 NOTE — ED Notes (Signed)
Pt states that he has had a rash on his left arm for three weeks. Pt states he has been using lotion to dry it out but not helping.

## 2013-05-16 NOTE — ED Provider Notes (Signed)
History    CSN: [id number] Arrival date & time 05/16/13  6644  First MD Initiated Contact with Patient 05/16/13 [phone]     Chief Complaint  Patient presents with  . poison ivy     HPI Pt was seen at 0800.  Per pt, c/o gradual onset and persistence of constant "itching rash" to his bilat arms that began approx 3 weeks ago. 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 Denies SOB, no wheezing, no hoarse voice, no drooling/stridor, no intra-oral edema/rash.    Past Medical History  Diagnosis Date  . Injury of right hip    History reviewed. No pertinent past surgical history.  History  Substance Use Topics  . Smoking status: Current Every Day Smoker -- 0.50 packs/day    Types: Cigarettes  . Smokeless tobacco: Never Used  . Alcohol Use: 3.6 oz/week    6 Cans of beer per week    Review of Systems ROS: Statement: All systems negative except as marked or noted in the HPI; Constitutional: Negative for fever and chills. ; ; Eyes: Negative for eye pain, redness and discharge. ; ; ENMT: Negative for ear pain, hoarseness, nasal congestion, sinus pressure and sore throat. ; ; Cardiovascular: Negative for chest pain, palpitations, diaphoresis, dyspnea and peripheral edema. ; ; Respiratory: Negative for cough, wheezing and stridor. ; ; Gastrointestinal: Negative for nausea, vomiting, diarrhea, abdominal pain, blood in stool, hematemesis, jaundice and rectal bleeding. . ; ; Genitourinary: Negative for dysuria, flank pain and hematuria. ; ; Musculoskeletal: Negative for back pain and neck pain. Negative for swelling and trauma.; ; Skin: +itching rash. Negative for abrasions, blisters, bruising and skin lesion.; ; Neuro: Negative for headache, lightheadedness and neck stiffness. Negative for weakness, altered level of consciousness , altered mental status, extremity weakness, paresthesias, involuntary movement, seizure  and syncope.       Allergies  Review of patient's allergies indicates no known allergies.  Home Medications   Current Outpatient Rx  Name  Route  Sig  Dispense  Refill  . ibuprofen (ADVIL,MOTRIN) 800 MG tablet   Oral   Take 800 mg by mouth every 8 (eight) hours as needed. For pain.         . Multiple Vitamins-Minerals (MULTIVITAMIN WITH MINERALS) tablet   Oral   Take 1 tablet by mouth daily.         . naproxen (NAPROSYN) 250 MG tablet   Oral   Take 250 mg by mouth 2 (two) times daily with a meal.         . oxyCODONE-acetaminophen (PERCOCET) 5-325 MG per tablet   Oral   Take 1 tablet by mouth every 4 (four) hours as needed. For pain relief         . EXPIRED: potassium chloride (K-DUR) 10 MEQ tablet   Oral   Take 1 tablet (10 mEq total) by mouth 2 (two) times daily.   30 tablet   0   . predniSONE (DELTASONE) 10 MG tablet      Take 5 tablets PO x2 days, then take 4 tabs PO x3 days, then 3 tabs PO x3 days, then 2 tabs PO x3 days, then 1 tab PO x3 days   40 tablet   0    Pulse 82  Temp(Src) 98.7 F (37.1 C) (Oral)  Resp 17  SpO2 100% Physical Exam 0805: Physical examination:  Nursing notes reviewed; Vital signs  and O2 SAT reviewed;  Constitutional: Well developed, Well nourished, Well hydrated, In no acute distress; Head:  Normocephalic, atraumatic; Eyes: EOMI, PERRL, No scleral icterus; ENMT: Mouth and pharynx normal, Mucous membranes moist. Mouth and pharynx without lesions. No tonsillar exudates. No intra-oral edema. No submandibular or sublingual edema. No hoarse voice, no drooling, no stridor. No pain with manipulation of larynx.;; Neck: Supple, Full range of motion, No lymphadenopathy; Cardiovascular: Regular rate and rhythm, No murmur, rub, or gallop; Respiratory: Breath sounds clear & equal bilaterally, No rales, rhonchi, wheezes.  Speaking full sentences with ease, Normal respiratory effort/excursion; Chest: Nontender, Movement normal; Abdomen: Soft,  Nontender, Nondistended, Normal bowel sounds;; Extremities: Pulses normal, No tenderness, No edema, No calf edema or asymmetry.; Neuro: AA&Ox3, Major CN grossly intact.  Speech clear. No gross focal motor or sensory deficits in extremities.; Skin: Color normal, Warm, Dry, +scattered areas of small erythematous vesicular rash to right and left arms.   ED Course  Procedures   MDM  MDM Reviewed: previous chart, nursing note and vitals   0815: Appears contact dermatitis due to poison plant. Will tx symptomatically at this time. Dx d/w pt.  Questions answered.  Verb understanding, agreeable to d/c home with outpt f/u.   Laray Anger, DO 05/17/13 2010

## 2013-06-13 IMAGING — CT CT FEMUR *L* W/O CM
1 series · 12 of 14 positions shown, 15 images · non-contrast
Comparison: Radiographs 12/18/2011

CLINICAL DATA: Injury with pain and swelling and bleeding.

CT OF THE LEFT FEMUR WITHOUT CONTRAST

[Series 2: lt femur · axial · 0.48mm/px · z∈[-521,-116]mm · 12 of 97 slices shown, 15 images]
[im 8/97  soft-tissue]
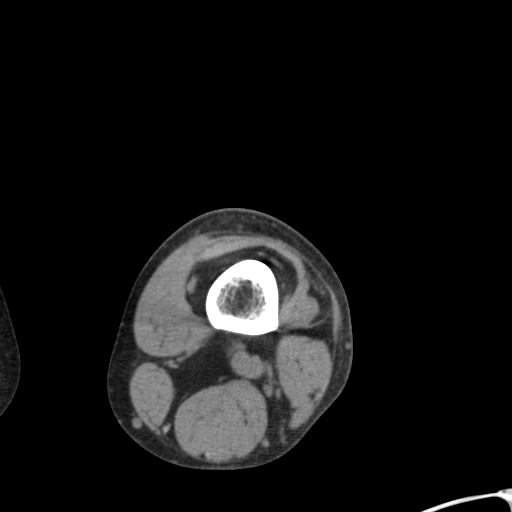
[im 8/97  bone]
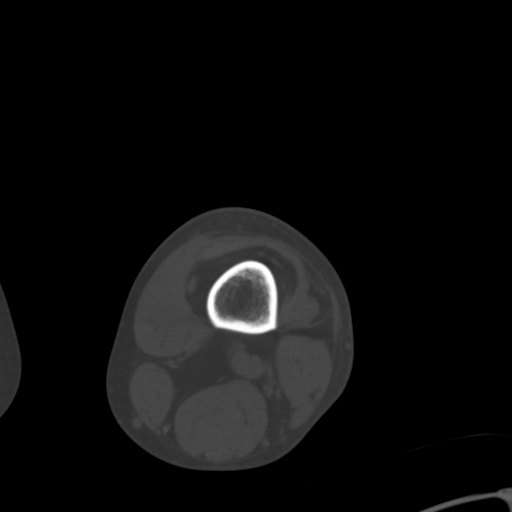
[im 15/97  bone]
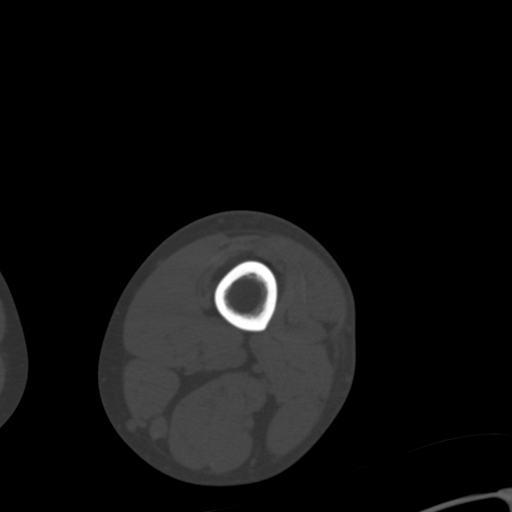
[im 23/97  bone]
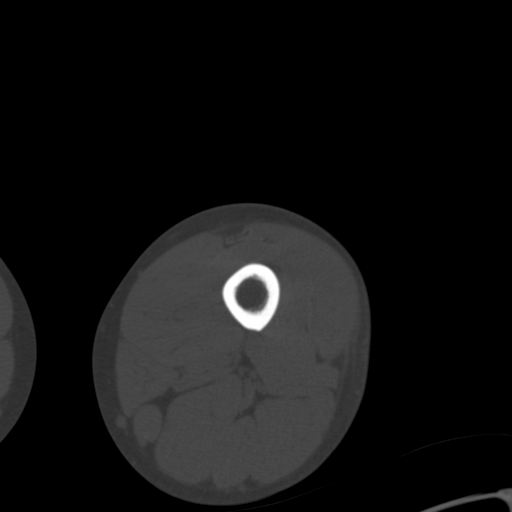
[im 30/97  bone]
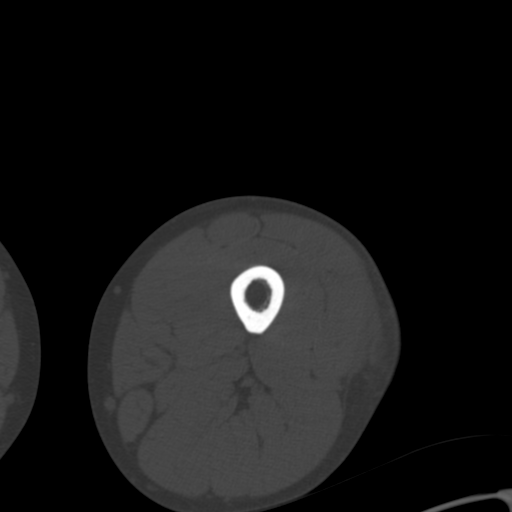
[im 37/97  soft-tissue]
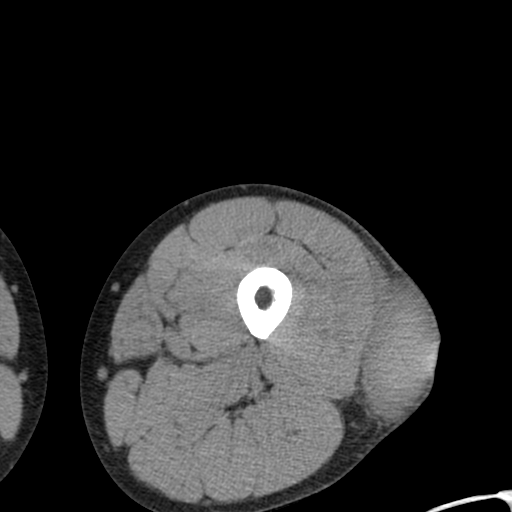
[im 37/97  bone]
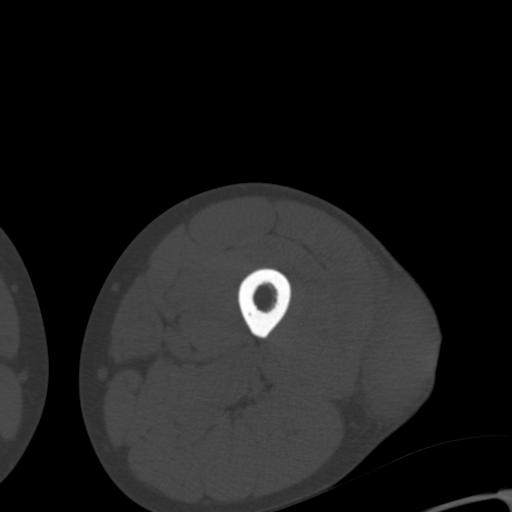
[im 45/97  bone]
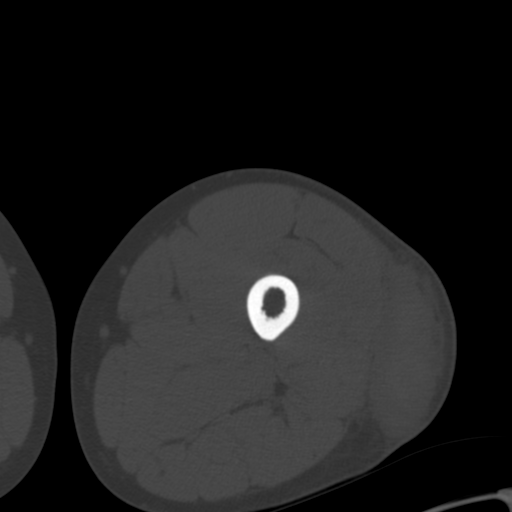
[im 52/97  bone]
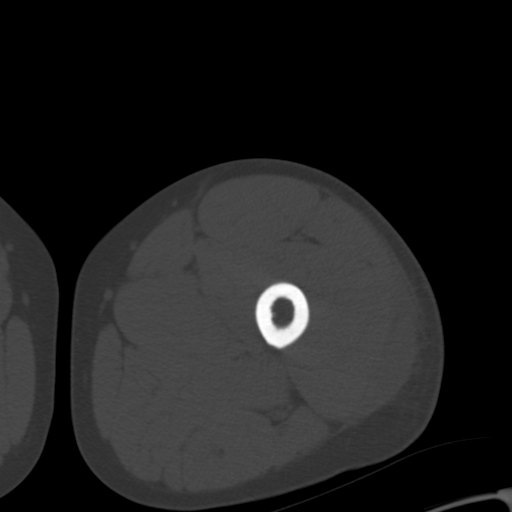
[im 60/97  bone]
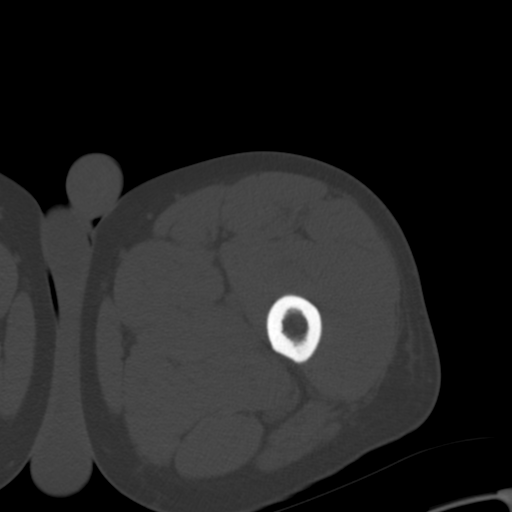
[im 67/97  soft-tissue]
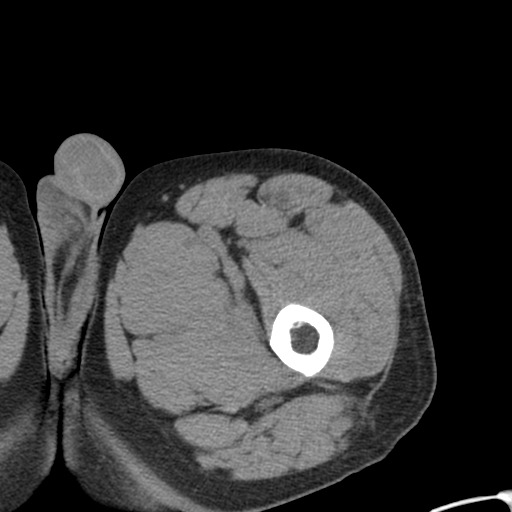
[im 67/97  bone]
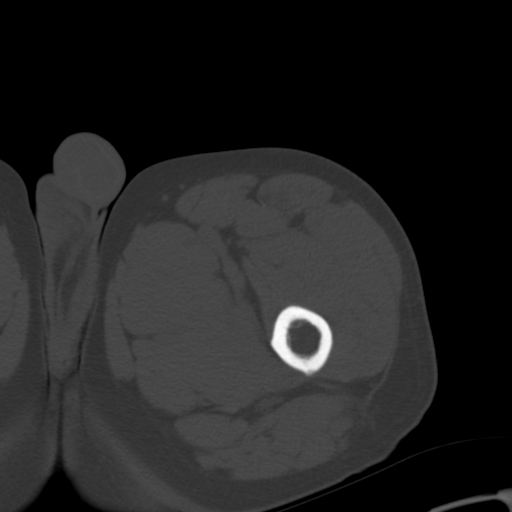
[im 74/97  bone]
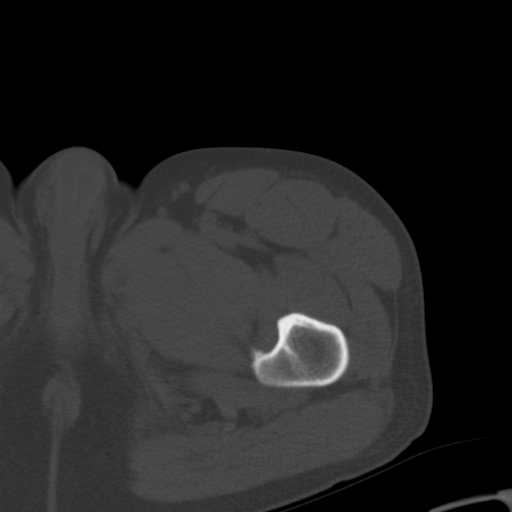
[im 82/97  bone]
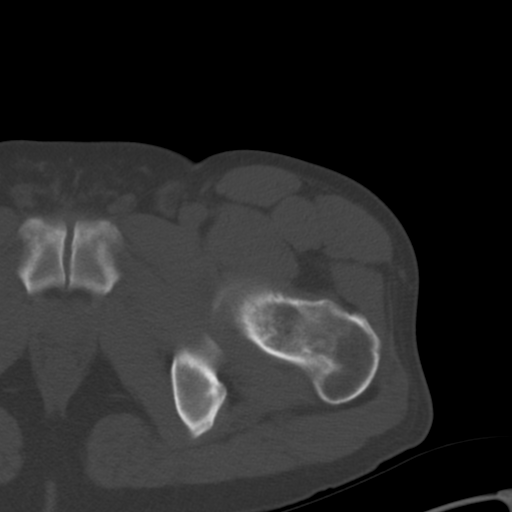
[im 89/97  bone]
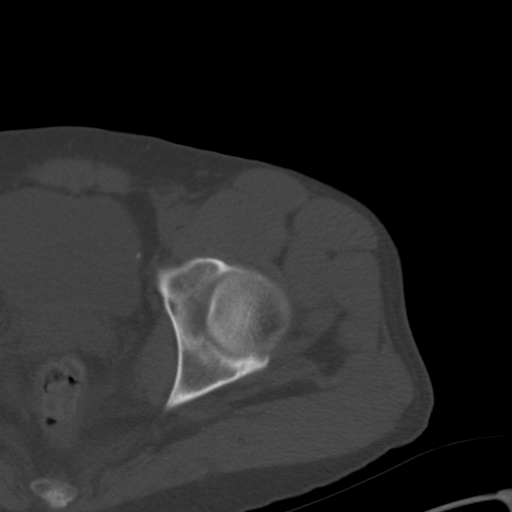

[12 of 14 positions shown; findings below may reference images not displayed]

FINDINGS: Hematoma is present in the subcutaneous tissues of the
left lateral thigh at the level of mid femur.  This measures 8 x 4
cm transverse dimension and extends over a 10 cm cranial caudal
dimension.  This is lateral to the muscles and is relatively
hyperdense suggesting this is a recent hematoma.

Negative for fracture.  No underlying bony abnormality.  Thigh
musculature is normal.
IMPRESSION: Large hematoma in the subcutaneous tissues in the lateral thigh.
No underlying bony abnormality.

## 2014-12-26 ENCOUNTER — Encounter (HOSPITAL_COMMUNITY): Payer: Self-pay

## 2014-12-26 ENCOUNTER — Emergency Department (HOSPITAL_COMMUNITY)
Admission: EM | Admit: 2014-12-26 | Discharge: 2014-12-26 | Disposition: A | Discharge status: Home / Self Care | Payer: Managed Care, Other (non HMO) | Attending: Emergency Medicine | Admitting: Emergency Medicine

## 2014-12-26 ENCOUNTER — Emergency Department (HOSPITAL_COMMUNITY): Admission: EM | Admit: 2014-12-26 | Discharge: 2014-12-26 | Discharge status: Absent without leave | Payer: Self-pay

## 2014-12-26 DIAGNOSIS — Z791 Long term (current) use of non-steroidal anti-inflammatories (NSAID): Secondary | ICD-10-CM | POA: Insufficient documentation

## 2014-12-26 DIAGNOSIS — Z79899 Other long term (current) drug therapy: Secondary | ICD-10-CM | POA: Insufficient documentation

## 2014-12-26 DIAGNOSIS — J069 Acute upper respiratory infection, unspecified: Secondary | ICD-10-CM | POA: Insufficient documentation

## 2014-12-26 DIAGNOSIS — I10 Essential (primary) hypertension: Secondary | ICD-10-CM

## 2014-12-26 DIAGNOSIS — Z87828 Personal history of other (healed) physical injury and trauma: Secondary | ICD-10-CM | POA: Diagnosis not present

## 2014-12-26 DIAGNOSIS — Z720 Tobacco use: Secondary | ICD-10-CM | POA: Insufficient documentation

## 2014-12-26 DIAGNOSIS — R0981 Nasal congestion: Secondary | ICD-10-CM

## 2014-12-26 NOTE — Discharge Instructions (Signed)
Return to the emergency room with worsening of symptoms, new symptoms or with symptoms that are concerning , especially fevers, stiff neck, worsening headache, nausea/vomiting, visual changes or slurred speech, chest pain, shortness of breath, cough with thick colored mucous or blood Drink plenty of fluids with electrolytes especially Gatorade. OTC cold medications such as mucinex, nyquil, dayquil are recommended. Chloraseptic for sore throat. Allegra, claritin, OR zyrtec for nasal congestion. Follow up with the wellness center. Call number above for recheck of your blood pressure. Read below information and follow recommendations.  Upper Respiratory Infection, Adult An upper respiratory infection (URI) is also sometimes known as the common cold. The upper respiratory tract includes the nose, sinuses, throat, trachea, and bronchi. Bronchi are the airways leading to the lungs. Most people improve within 1 week, but symptoms can last up to 2 weeks. A residual cough may last even longer.  CAUSES Many different viruses can infect the tissues lining the upper respiratory tract. The tissues become irritated and inflamed and often become very moist. Mucus production is also common. A cold is contagious. You can easily spread the virus to others by oral contact. This includes kissing, sharing a glass, coughing, or sneezing. Touching your mouth or nose and then touching a surface, which is then touched by another person, can also spread the virus. SYMPTOMS  Symptoms typically develop 1 to 3 days after you come in contact with a cold virus. Symptoms vary from person to person. They may include:  Runny nose.  Sneezing.  Nasal congestion.  Sinus irritation.  Sore throat.  Loss of voice (laryngitis).  Cough.  Fatigue.  Muscle aches.  Loss of appetite.  Headache.  Low-grade fever. DIAGNOSIS  You might diagnose your own cold based on familiar symptoms, since most people get a cold 2 to 3 times  a year. Your caregiver can confirm this based on your exam. Most importantly, your caregiver can check that your symptoms are not due to another disease such as strep throat, sinusitis, pneumonia, asthma, or epiglottitis. Blood tests, throat tests, and X-rays are not necessary to diagnose a common cold, but they may sometimes be helpful in excluding other more serious diseases. Your caregiver will decide if any further tests are required. RISKS AND COMPLICATIONS  You may be at risk for a more severe case of the common cold if you smoke cigarettes, have chronic heart disease (such as heart failure) or lung disease (such as asthma), or if you have a weakened immune system. The very young and very old are also at risk for more serious infections. Bacterial sinusitis, middle ear infections, and bacterial pneumonia can complicate the common cold. The common cold can worsen asthma and chronic obstructive pulmonary disease (COPD). Sometimes, these complications can require emergency medical care and may be life-threatening. PREVENTION  The best way to protect against getting a cold is to practice good hygiene. Avoid oral or hand contact with people with cold symptoms. Wash your hands often if contact occurs. There is no clear evidence that vitamin C, vitamin E, echinacea, or exercise reduces the chance of developing a cold. However, it is always recommended to get plenty of rest and practice good nutrition. TREATMENT  Treatment is directed at relieving symptoms. There is no cure. Antibiotics are not effective, because the infection is caused by a virus, not by bacteria. Treatment may include:  Increased fluid intake. Sports drinks offer valuable electrolytes, sugars, and fluids.  Breathing heated mist or steam (vaporizer or shower).  Eating chicken soup  or other clear broths, and maintaining good nutrition.  Getting plenty of rest.  Using gargles or lozenges for comfort.  Controlling fevers with ibuprofen  or acetaminophen as directed by your caregiver.  Increasing usage of your inhaler if you have asthma. Zinc gel and zinc lozenges, taken in the first 24 hours of the common cold, can shorten the duration and lessen the severity of symptoms. Pain medicines may help with fever, muscle aches, and throat pain. A variety of non-prescription medicines are available to treat congestion and runny nose. Your caregiver can make recommendations and may suggest nasal or lung inhalers for other symptoms.  HOME CARE INSTRUCTIONS   Only take over-the-counter or prescription medicines for pain, discomfort, or fever as directed by your caregiver.  Use a warm mist humidifier or inhale steam from a shower to increase air moisture. This may keep secretions moist and make it easier to breathe.  Drink enough water and fluids to keep your urine clear or pale yellow.  Rest as needed.  Return to work when your temperature has returned to normal or as your caregiver advises. You may need to stay home longer to avoid infecting others. You can also use a face mask and careful hand washing to prevent spread of the virus. SEEK MEDICAL CARE IF:   After the first few days, you feel you are getting worse rather than better.  You need your caregiver's advice about medicines to control symptoms.  You develop chills, worsening shortness of breath, or brown or red sputum. These may be signs of pneumonia.  You develop yellow or brown nasal discharge or pain in the face, especially when you bend forward. These may be signs of sinusitis.  You develop a fever, swollen neck glands, pain with swallowing, or white areas in the back of your throat. These may be signs of strep throat. SEEK IMMEDIATE MEDICAL CARE IF:   You have a fever.  You develop severe or persistent headache, ear pain, sinus pain, or chest pain.  You develop wheezing, a prolonged cough, cough up blood, or have a change in your usual mucus (if you have chronic  lung disease).  You develop sore muscles or a stiff neck. Document Released: 04/22/2001 Document Revised: 01/19/2012 Document Reviewed: 02/01/2014 [facility] Patient Information 2015 [place], [place]. This information is not intended to replace advice given to you by your health care provider. Make sure you discuss any questions you have with your health care provider.

## 2014-12-26 NOTE — ED Provider Notes (Signed)
CSN: [id number]     Arrival date & time 12/26/14  1209 History  This chart was scribed for non-physician practitioner, Oswaldo ConroyVictoria Anesa Fronek, PA-C working with Elwin MochaBlair Walden, MD by Luisa DagoPriscilla Tutu, ED scribe. This patient was seen in room WTR7/WTR7 and the patient's care was started at 12:38 PM.    Chief Complaint  Patient presents with  . Nasal Congestion   The history is provided by the patient and medical records. No language interpreter was used.   HPI Comments: Candis SchatzDavid W Adell is a 39 y.o. male with no pertinent medical hx presents to the Emergency Department complaining of sudden onset nasal congestion that started 3 days ago. Pt states that he is feeling better however, he is unable to go back to work until her is medically cleared. Pt reports a subjective fever 2 days ago which has now resolved. Current ED temperature is 98.7. Reports taking theraflu and keeping hydrated with adequate relief of his symptoms. He denies any productive cough, abdominal pain, SOB, chills, difficulty breathing, or HA.   Past Medical History  Diagnosis Date  . Injury of right hip    History reviewed. No pertinent past surgical history. History reviewed. No pertinent family history. History  Substance Use Topics  . Smoking status: Current Every Day Smoker -- 0.50 packs/day    Types: Cigarettes  . Smokeless tobacco: Never Used  . Alcohol Use: 3.6 oz/week    6 Cans of beer per week    Review of Systems  Constitutional: Positive for fever. Negative for chills.  HENT: Positive for congestion. Negative for rhinorrhea and sinus pressure.   Respiratory: Negative for cough and shortness of breath.   Gastrointestinal: Negative for nausea, vomiting and abdominal pain.  Neurological: Negative for headaches.   Allergies  Review of patient's allergies indicates no known allergies.  Home Medications   Prior to Admission medications   Medication Sig Start Date End Date Taking? Authorizing Provider  ibuprofen  (ADVIL,MOTRIN) 800 MG tablet Take 800 mg by mouth every 8 (eight) hours as needed. For pain.    Historical Provider, MD  Multiple Vitamins-Minerals (MULTIVITAMIN WITH MINERALS) tablet Take 1 tablet by mouth daily.    Historical Provider, MD  naproxen (NAPROSYN) 250 MG tablet Take 250 mg by mouth 2 (two) times daily with a meal.    Historical Provider, MD  oxyCODONE-acetaminophen (PERCOCET) 5-325 MG per tablet Take 1 tablet by mouth every 4 (four) hours as needed. For pain relief    Historical Provider, MD  potassium chloride (K-DUR) 10 MEQ tablet Take 1 tablet (10 mEq total) by mouth 2 (two) times daily. 12/18/11 12/17/12  Drucie OpitzBethany Hunt, PA-C  predniSONE (DELTASONE) 10 MG tablet Take 5 tablets PO x2 days, then take 4 tabs PO x3 days, then 3 tabs PO x3 days, then 2 tabs PO x3 days, then 1 tab PO x3 days 05/16/13   Samuel JesterKathleen McManus, DO   BP 151/100 mmHg  Pulse 102  Temp(Src) 98.7 F (37.1 C) (Oral)  SpO2 98%   Physical Exam  Constitutional: He is oriented to person, place, and time. He appears well-developed and well-nourished. No distress.  HENT:  Head: Normocephalic and atraumatic.  Nose: Right sinus exhibits no maxillary sinus tenderness and no frontal sinus tenderness. Left sinus exhibits no maxillary sinus tenderness and no frontal sinus tenderness.  Mouth/Throat: Mucous membranes are normal. Posterior oropharyngeal erythema present. No oropharyngeal exudate or posterior oropharyngeal edema.  Eyes: Conjunctivae and EOM are normal. Right eye exhibits no discharge. Left eye exhibits no  discharge.  Neck: Normal range of motion. Neck supple.  Cardiovascular: Normal rate, regular rhythm and normal heart sounds.   Pulmonary/Chest: Effort normal and breath sounds normal. No respiratory distress. He has no wheezes. He has no rales.  Abdominal: Soft. Bowel sounds are normal. He exhibits no distension. There is no tenderness.  Lymphadenopathy:    He has no cervical adenopathy.  Neurological: He is alert  and oriented to person, place, and time.  Skin: Skin is warm and dry. He is not diaphoretic.  Psychiatric: He has a normal mood and affect.  Nursing note and vitals reviewed.   ED Course  Procedures (including critical care time)  DIAGNOSTIC STUDIES:   COORDINATION OF CARE: 12:42 PM- Advised pt to stay hydrated.  Pt advised of plan for treatment and pt agrees.  Labs Review Labs Reviewed - No data to display  Imaging Review No results found.   EKG Interpretation None      MDM   Final diagnoses:  Nasal congestion  Upper respiratory infection  Essential hypertension   Patients symptoms are consistent with URI, likely viral etiology. Discussed that antibiotics are not indicated for viral infections. Pt will be discharged with symptomatic treatment.  Verbalizes understanding and is agreeable with plan. Pt is hemodynamically stable & in NAD prior to dc.  Patient noted to be hypertensive in the emergency department.  No signs of hypertensive urgency.  Discussed with patient the need for close follow-up and management by a primary care physician. Pt given referral to the wellness center.   Discussed return precautions with patient. Discussed all results and patient verbalizes understanding and agrees with plan.  I personally performed the services described in this documentation, which was scribed in my presence. The recorded information has been reviewed and is accurate.    Louann Sjogren, PA-C 12/26/14 1314  Elwin Mocha, MD 12/26/14 [phone]

## 2014-12-26 NOTE — ED Notes (Signed)
Pt stated that he was have cold symptoms this weekend and could not work. He needs to be seen before he can return to work.

## 2014-12-26 NOTE — ED Notes (Signed)
Pt has had a cold/cough/congestion since Saturday.  Pt is feeling better and wants to go back to work.  Cannot be released until cleared by MD.

## 2018-03-12 ENCOUNTER — Emergency Department (HOSPITAL_COMMUNITY): Payer: PRIVATE HEALTH INSURANCE

## 2018-03-12 ENCOUNTER — Emergency Department (HOSPITAL_COMMUNITY)
Admission: EM | Admit: 2018-03-12 | Discharge: 2018-03-12 | Disposition: A | Discharge status: Home / Self Care | Payer: PRIVATE HEALTH INSURANCE | Attending: Emergency Medicine | Admitting: Emergency Medicine

## 2018-03-12 ENCOUNTER — Encounter (HOSPITAL_COMMUNITY): Payer: Self-pay | Admitting: Emergency Medicine

## 2018-03-12 DIAGNOSIS — M79672 Pain in left foot: Secondary | ICD-10-CM | POA: Diagnosis present

## 2018-03-12 DIAGNOSIS — M109 Gout, unspecified: Secondary | ICD-10-CM | POA: Diagnosis not present

## 2018-03-12 DIAGNOSIS — F17210 Nicotine dependence, cigarettes, uncomplicated: Secondary | ICD-10-CM | POA: Diagnosis not present

## 2018-03-12 DIAGNOSIS — Z79899 Other long term (current) drug therapy: Secondary | ICD-10-CM | POA: Diagnosis not present

## 2018-03-12 LAB — CBC WITH DIFFERENTIAL/PLATELET
BASOS ABS: 0 10*3/uL (ref 0.0–0.1)
BASOS PCT: 0 %
EOS ABS: 0.1 10*3/uL (ref 0.0–0.7)
Eosinophils Relative: 1 %
HEMATOCRIT: 41.9 % (ref 39.0–52.0)
HEMOGLOBIN: 14.6 g/dL (ref 13.0–17.0)
Lymphocytes Relative: 35 %
Lymphs Abs: 2.5 10*3/uL (ref 0.7–4.0)
MCH: 32.8 pg (ref 26.0–34.0)
MCHC: 34.8 g/dL (ref 30.0–36.0)
MCV: 94.2 fL (ref 78.0–100.0)
Monocytes Absolute: 0.7 10*3/uL (ref 0.1–1.0)
Monocytes Relative: 10 %
NEUTROS ABS: 3.8 10*3/uL (ref 1.7–7.7)
NEUTROS PCT: 54 %
Platelets: 163 10*3/uL (ref 150–400)
RBC: 4.45 MIL/uL (ref 4.22–5.81)
RDW: 13.7 % (ref 11.5–15.5)
WBC: 7.1 10*3/uL (ref 4.0–10.5)

## 2018-03-12 LAB — I-STAT CHEM 8, ED
BUN: 9 mg/dL (ref 6–20)
CHLORIDE: 105 mmol/L (ref 101–111)
Calcium, Ion: 1.2 mmol/L (ref 1.15–1.40)
Creatinine, Ser: 0.9 mg/dL (ref 0.61–1.24)
Glucose, Bld: 104 mg/dL — ABNORMAL HIGH (ref 65–99)
HEMATOCRIT: 44 % (ref 39.0–52.0)
Hemoglobin: 15 g/dL (ref 13.0–17.0)
Potassium: 3.9 mmol/L (ref 3.5–5.1)
SODIUM: 144 mmol/L (ref 135–145)
TCO2: 27 mmol/L (ref 22–32)

## 2018-03-12 LAB — URIC ACID: URIC ACID, SERUM: 7.4 mg/dL (ref 4.4–7.6)

## 2018-03-12 MED ORDER — PREDNISONE 20 MG PO TABS: 40.0000 mg | ORAL_TABLET | Freq: Every day | ORAL | 0 refills | Status: AC

## 2018-03-12 MED ORDER — HYDROCODONE-ACETAMINOPHEN 5-325 MG PO TABS: 1.0000 | ORAL_TABLET | Freq: Four times a day (QID) | ORAL | 0 refills | Status: AC | PRN

## 2018-03-12 NOTE — ED Provider Notes (Signed)
[facility]-EMERGENCY DEPT Provider Note   CSN: [id number] Arrival date & time: 03/12/18  0950     History   Chief Complaint Chief Complaint  Patient presents with  . Foot Pain    HPI Antonio Erickson is a 42 y.o. male.  HPI COUGAR IMEL is a 42 y.o. male presents to emergency department complaining of pain to the right great toe.  Patient states he thinks he hit his toe on something 1 week ago.  Ever since then patient states that the toe has been red, swollen, tender.  He is not having any difficulty ambulating.  He states he is having trouble moving the toe.  Denies any numbness or weakness.  No fever or chills.  No history of the same.  Denies history of CAD, however he states it runs in his family.  Past Medical History:  Diagnosis Date  . Injury of right hip     Patient Active Problem List   Diagnosis Date Noted  . Open thigh wound 01/19/2012    History reviewed. No pertinent surgical history.      Home Medications    Prior to Admission medications   Medication Sig Start Date End Date Taking? Authorizing Provider  ibuprofen (ADVIL,MOTRIN) 800 MG tablet Take 800 mg by mouth every 8 (eight) hours as needed. For pain.    [provider]  Multiple Vitamins-Minerals (MULTIVITAMIN WITH MINERALS) tablet Take 1 tablet by mouth daily.    [provider]  naproxen (NAPROSYN) 250 MG tablet Take 250 mg by mouth 2 (two) times daily with a meal.    [provider]  oxyCODONE-acetaminophen (PERCOCET) 5-325 MG per tablet Take 1 tablet by mouth every 4 (four) hours as needed. For pain relief    [provider]  potassium chloride (K-DUR) 10 MEQ tablet Take 1 tablet (10 mEq total) by mouth 2 (two) times daily. 12/18/11 12/17/12  Hunt, Toma Copier, PA-C  predniSONE (DELTASONE) 10 MG tablet Take 5 tablets PO x2 days, then take 4 tabs PO x3 days, then 3 tabs PO x3 days, then 2 tabs PO x3 days, then 1 tab PO x3 days 05/16/13   Samuel Jester, DO    Family History No family history on file.  Social History Social History   Tobacco Use  . Smoking status: Current Every Day Smoker    Packs/day: 0.50    Types: Cigarettes  . Smokeless tobacco: Never Used  Substance Use Topics  . Alcohol use: Yes    Alcohol/week: 3.6 oz    Types: 6 Cans of beer per week  . Drug use: No     Allergies   Patient has no known allergies.   Review of Systems Review of Systems  Constitutional: Negative for chills and fever.  Respiratory: Negative for cough, chest tightness and shortness of breath.   Cardiovascular: Negative for chest pain, palpitations and leg swelling.  Musculoskeletal: Positive for arthralgias and joint swelling. Negative for myalgias, neck pain and neck stiffness.  Skin: Negative for rash.  Allergic/Immunologic: Negative for immunocompromised state.  Neurological: Negative for dizziness, weakness, light-headedness, numbness and headaches.     Physical Exam Updated Vital Signs BP (!) 132/107 (BP Location: Right Arm)   Pulse (!) 102   Temp 98.5 F (36.9 C) (Oral)   Resp 18   SpO2 98%   Physical Exam  Constitutional: He appears well-developed and well-nourished. No distress.  Eyes: Conjunctivae are normal.  Neck: Neck supple.  Cardiovascular: Normal rate.  Pulmonary/Chest: No respiratory distress.  Abdominal: He exhibits no distension.  Musculoskeletal:  Significant swelling to the left great toe and 1st MTP joint. ttp over the joint and the toe. Cap refill <2 sec. Toe and the joint are erythematous. Normal ankle and rest of the foot. No skin sores or lesions.   Skin: Skin is warm and dry.  Nursing note and vitals reviewed.    ED Treatments / Results  Labs (all labs ordered are listed, but only abnormal results are displayed) Labs Reviewed  CBC WITH DIFFERENTIAL/PLATELET  URIC ACID  I-STAT CHEM 8, ED    EKG None  Radiology Dg Foot Complete Left  Result Date: 03/12/2018 CLINICAL DATA:   Pain, swelling, redness at the 1st MTP joint. Injury. EXAM: LEFT FOOT - COMPLETE 3+ VIEW COMPARISON:  None. FINDINGS: Mild degenerative changes at the 1st MTP joint with joint space narrowing and spurring. No acute bony abnormality. Specifically, no fracture, subluxation, or dislocation. IMPRESSION: No acute bony abnormality. Mild degenerative changes at the 1st MTP joint. Electronically Signed   By: Charlett Nose M.D.   On: 03/12/2018 10:36    Procedures Procedures (including critical care time)  Medications Ordered in ED Medications - No data to display   Initial Impression / Assessment and Plan / ED Course  I have reviewed the triage vital signs and the nursing notes.  Pertinent labs & imaging results that were available during my care of the patient were reviewed by me and considered in my medical decision making (see chart for details).     Pt with redness, swelling, ttp to the left great toe. Maybe hit it on something a week ago. Xray negative. Suspect gout vs septic joint. Will check cbc, chem 8, uric acid   Normal WBC. Normal uric acid. Discussed with Dr. Freida Busman, most likely gout given location, no fever, no leukocytosis. Will start on steroids and pain medications. Follow up with pcp. Return precautions discussed.   Vitals:   03/12/18 1017 03/12/18 1145 03/12/18 1431  BP: (!) 132/107 (!) 137/97 (!) 154/114  Pulse: (!) 102 (!) 101 93  Resp: Temp: 98.5 F (36.9 C)    TempSrc: Oral    SpO2: 98% 96% 100%     Final Clinical Impressions(s) / ED Diagnoses   Final diagnoses:  Acute gout involving toe of left foot, unspecified cause    ED Discharge Orders        Ordered    predniSONE (DELTASONE) 20 MG tablet  Daily     03/12/18 1418    HYDROcodone-acetaminophen (NORCO) 5-325 MG tablet  Every 6 hours PRN     03/12/18 1418       Jaynie Crumble, PA-C 03/12/18 1612

## 2018-03-12 NOTE — ED Triage Notes (Addendum)
Patient here from home with complaints of left foot pain after hitting it last week. Swelling and bruising noted to left big toe. Ambulatory.

## 2018-03-12 NOTE — ED Provider Notes (Signed)
Medical screening examination/treatment/procedure(s) were conducted as a shared visit with non-physician practitioner(s) and myself.  I personally evaluated the patient during the encounter.  None 42 year old male here with right great toe pain.  Patient clinically appears to have gout.  Will place on steroids and discharged home   Lorre Nick, MD 03/12/18 [phone]

## 2018-03-12 NOTE — Discharge Instructions (Addendum)
See attached print out on gout and diet.  Take prednisone as prescribed until all gone.  Take Vicodin for severe pain only.  Follow-up with family doctor

## 2024-01-20 ENCOUNTER — Emergency Department (HOSPITAL_COMMUNITY)

## 2024-01-20 ENCOUNTER — Emergency Department (HOSPITAL_COMMUNITY)
Admission: EM | Admit: 2024-01-20 | Discharge: 2024-01-20 | Disposition: A | Discharge status: Home / Self Care | Attending: Emergency Medicine | Admitting: Emergency Medicine

## 2024-01-20 ENCOUNTER — Other Ambulatory Visit: Payer: Self-pay

## 2024-01-20 DIAGNOSIS — J101 Influenza due to other identified influenza virus with other respiratory manifestations: Secondary | ICD-10-CM | POA: Diagnosis not present

## 2024-01-20 DIAGNOSIS — K760 Fatty (change of) liver, not elsewhere classified: Secondary | ICD-10-CM | POA: Diagnosis not present

## 2024-01-20 DIAGNOSIS — R000 Tachycardia, unspecified: Secondary | ICD-10-CM | POA: Diagnosis not present

## 2024-01-20 DIAGNOSIS — E119 Type 2 diabetes mellitus without complications: Secondary | ICD-10-CM | POA: Diagnosis not present

## 2024-01-20 DIAGNOSIS — R17 Unspecified jaundice: Secondary | ICD-10-CM

## 2024-01-20 DIAGNOSIS — R059 Cough, unspecified: Secondary | ICD-10-CM | POA: Diagnosis present

## 2024-01-20 LAB — CBC
HCT: 46.6 % (ref 39.0–52.0)
Hemoglobin: 15.7 g/dL (ref 13.0–17.0)
MCH: 33 pg (ref 26.0–34.0)
MCHC: 33.7 g/dL (ref 30.0–36.0)
MCV: 97.9 fL (ref 80.0–100.0)
Platelets: 102 10*3/uL — ABNORMAL LOW (ref 150–400)
RBC: 4.76 MIL/uL (ref 4.22–5.81)
RDW: 14.8 % (ref 11.5–15.5)
WBC: 5.4 10*3/uL (ref 4.0–10.5)
nRBC: 0 % (ref 0.0–0.2)

## 2024-01-20 LAB — URINALYSIS, ROUTINE W REFLEX MICROSCOPIC
Bacteria, UA: NONE SEEN
Glucose, UA: NEGATIVE mg/dL
Hgb urine dipstick: NEGATIVE
Ketones, ur: NEGATIVE mg/dL
Leukocytes,Ua: NEGATIVE
Nitrite: NEGATIVE
Protein, ur: >=300 mg/dL — AB
Specific Gravity, Urine: 1.033 — ABNORMAL HIGH (ref 1.005–1.030)
pH: 6 (ref 5.0–8.0)

## 2024-01-20 LAB — COMPREHENSIVE METABOLIC PANEL
ALT: 68 U/L — ABNORMAL HIGH (ref 0–44)
AST: 188 U/L — ABNORMAL HIGH (ref 15–41)
Albumin: 4.3 g/dL (ref 3.5–5.0)
Alkaline Phosphatase: 135 U/L — ABNORMAL HIGH (ref 38–126)
Anion gap: 15 (ref 5–15)
BUN: 10 mg/dL (ref 6–20)
CO2: 25 mmol/L (ref 22–32)
Calcium: 9.5 mg/dL (ref 8.9–10.3)
Chloride: 99 mmol/L (ref 98–111)
Creatinine, Ser: 0.81 mg/dL (ref 0.61–1.24)
GFR, Estimated: >60 mL/min (ref >60)
Glucose, Bld: 169 mg/dL — ABNORMAL HIGH (ref 70–99)
Potassium: 3.3 mmol/L — ABNORMAL LOW (ref 3.5–5.1)
Sodium: 139 mmol/L (ref 135–145)
Total Bilirubin: 3.1 mg/dL — ABNORMAL HIGH (ref 0.0–1.2)
Total Protein: 8.4 g/dL — ABNORMAL HIGH (ref 6.5–8.1)

## 2024-01-20 LAB — RESP PANEL BY RT-PCR (RSV, FLU A&B, COVID)  RVPGX2
Influenza A by PCR: POSITIVE — AB
Influenza B by PCR: NEGATIVE
Resp Syncytial Virus by PCR: NEGATIVE
SARS Coronavirus 2 by RT PCR: NEGATIVE

## 2024-01-20 LAB — LIPASE, BLOOD: Lipase: 26 U/L (ref 11–51)

## 2024-01-20 LAB — TROPONIN I (HIGH SENSITIVITY): Troponin I (High Sensitivity): 12 ng/L (ref <18)

## 2024-01-20 MED ORDER — FAMOTIDINE IN NACL 20-0.9 MG/50ML-% IV SOLN
20.0000 mg | Freq: Once | INTRAVENOUS | Status: AC
  Administered 2024-01-20: 20 mg via INTRAVENOUS
  Filled 2024-01-20: qty 50

## 2024-01-20 MED ORDER — LACTATED RINGERS IV BOLUS
1000.0000 mL | Freq: Once | INTRAVENOUS | Status: AC
  Administered 2024-01-20: 1000 mL via INTRAVENOUS

## 2024-01-20 MED ORDER — ACETAMINOPHEN 325 MG PO TABS
650.0000 mg | ORAL_TABLET | Freq: Once | ORAL | Status: AC
  Administered 2024-01-20: 650 mg via ORAL
  Filled 2024-01-20: qty 2

## 2024-01-20 MED ORDER — IBUPROFEN 200 MG PO TABS
400.0000 mg | ORAL_TABLET | Freq: Once | ORAL | Status: AC
  Administered 2024-01-20: 400 mg via ORAL
  Filled 2024-01-20: qty 2

## 2024-01-20 MED ORDER — ONDANSETRON HCL 4 MG PO TABS: 4.0000 mg | ORAL_TABLET | Freq: Four times a day (QID) | ORAL | 0 refills | Status: AC

## 2024-01-20 MED ORDER — ONDANSETRON HCL 4 MG/2ML IJ SOLN
4.0000 mg | Freq: Once | INTRAMUSCULAR | Status: AC
  Administered 2024-01-20: 4 mg via INTRAVENOUS
  Filled 2024-01-20: qty 2

## 2024-01-20 MED ORDER — BENZONATATE 100 MG PO CAPS: 100.0000 mg | ORAL_CAPSULE | Freq: Three times a day (TID) | ORAL | 0 refills | Status: AC

## 2024-01-20 NOTE — Discharge Instructions (Signed)
 You were seen in the emergency department for your nausea, cough and congestion.  You tested positive for influenza A.  You did not require treatment with any antiviral medications can continue symptomatic treatment with Tylenol and Motrin as needed for fevers and bodyaches and both can be taken up to every 6 hours.  I have given you prescription for Zofran that you can take as needed for nausea.  You can try Tessalon, Mucinex or raw honey as needed for your cough and you can use a humidifier or hot steam from the shower to help with your congestion as well as over-the-counter and nasal decongestant sprays.  Your liver numbers were also slightly elevated from your baseline today but your liver and gallbladder ultrasound just showed a fatty liver. You can follow-up with your primary doctor in the next few days to have your symptoms and liver numbers rechecked.  You should return to the emergency department for significantly worsening shortness of breath, severe chest pain, repetitive vomiting or if you have any other new or concerning symptoms.

## 2024-01-20 NOTE — ED Triage Notes (Signed)
 Pt reports N/V since Monday. Denies diarrhea.

## 2024-01-20 NOTE — ED Provider Notes (Signed)
 [place] EMERGENCY DEPARTMENT AT [facility] Provider Note   CSN: [id number] Arrival date & time: 01/20/24  4098     History  Chief Complaint  Patient presents with   Nausea   Emesis    Antonio Erickson is a 48 y.o. male.  Patient is a 49 year old male with past medical history of diabetes presenting to the emergency department with nausea and a cough.  Patient states that he has been feeling sick since Monday.  He states that he has had nausea and vomiting as well as a cough productive of mucus.  He states that sometimes he will also cough so hard it makes him vomit.  He reports associated chills and congestion.  He states that he is also been having chest pain since Monday that feels like a pressure and discomfort type of pain.  Denies any shortness of breath.  He denies any diarrhea or or lower extremity swelling.  He states one of his grandkids recently tested positive for the flu.  The history is provided by the patient and a relative.  Emesis      Home Medications Prior to Admission medications   Medication Sig Start Date End Date Taking? Authorizing Provider  benzonatate (TESSALON) 100 MG capsule Take 1 capsule (100 mg total) by mouth every 8 (eight) hours. 01/20/24  Yes Theresia Lo, Turkey K, DO  ondansetron (ZOFRAN) 4 MG tablet Take 1 tablet (4 mg total) by mouth every 6 (six) hours. 01/20/24  Yes Elayne Snare K, DO  HYDROcodone-acetaminophen (NORCO) 5-325 MG tablet Take 1 tablet by mouth every 6 (six) hours as needed for moderate pain. 03/12/18   Kirichenko, Tatyana, PA-C  ibuprofen (ADVIL,MOTRIN) 800 MG tablet Take 800 mg by mouth every 8 (eight) hours as needed. For pain.    [provider]  Multiple Vitamins-Minerals (MULTIVITAMIN WITH MINERALS) tablet Take 1 tablet by mouth daily.    [provider]  naproxen (NAPROSYN) 250 MG tablet Take 250 mg by mouth 2 (two) times daily with a meal.    [provider]   oxyCODONE-acetaminophen (PERCOCET) 5-325 MG per tablet Take 1 tablet by mouth every 4 (four) hours as needed. For pain relief    [provider]  potassium chloride (K-DUR) 10 MEQ tablet Take 1 tablet (10 mEq total) by mouth 2 (two) times daily. 12/18/11 12/17/12  Hunt, Toma Copier, PA-C  predniSONE (DELTASONE) 20 MG tablet Take 2 tablets (40 mg total) by mouth daily. 03/12/18   Jaynie Crumble, PA-C      Allergies    Patient has no known allergies.    Review of Systems   Review of Systems  Gastrointestinal:  Positive for vomiting.    Physical Exam Updated Vital Signs BP (!) 136/94   Pulse 88   Temp 100.1 F (37.8 C) (Oral)   Resp 14   SpO2 93%  Physical Exam Vitals and nursing note reviewed.  Constitutional:      General: He is not in acute distress.    Appearance: Normal appearance.  HENT:     Head: Normocephalic and atraumatic.     Nose: Congestion present.     Mouth/Throat:     Mouth: Mucous membranes are dry.     Pharynx: Oropharynx is clear.  Eyes:     Conjunctiva/sclera: Conjunctivae normal.  Cardiovascular:     Rate and Rhythm: Regular rhythm. Tachycardia present.     Heart sounds: Normal heart sounds.     Comments: No chest wall tenderness to palpation Pulmonary:  Effort: Pulmonary effort is normal.     Breath sounds: Normal breath sounds.  Abdominal:     General: Abdomen is flat.     Palpations: Abdomen is soft.     Tenderness: There is no abdominal tenderness.  Musculoskeletal:        General: Normal range of motion.     Cervical back: Normal range of motion and neck supple.     Right lower leg: No edema.     Left lower leg: No edema.  Skin:    General: Skin is warm and dry.  Neurological:     General: No focal deficit present.     Mental Status: He is alert and oriented to person, place, and time.  Psychiatric:        Mood and Affect: Mood normal.        Behavior: Behavior normal.     ED Results / Procedures / Treatments   Labs (all  labs ordered are listed, but only abnormal results are displayed) Labs Reviewed  RESP PANEL BY RT-PCR (RSV, FLU A&B, COVID)  RVPGX2 - Abnormal; Notable for the following components:      Result Value   Influenza A by PCR POSITIVE (*)    All other components within normal limits  COMPREHENSIVE METABOLIC PANEL - Abnormal; Notable for the following components:   Potassium 3.3 (*)    Glucose, Bld 169 (*)    Total Protein 8.4 (*)    AST 188 (*)    ALT 68 (*)    Alkaline Phosphatase 135 (*)    Total Bilirubin 3.1 (*)    All other components within normal limits  CBC - Abnormal; Notable for the following components:   Platelets 102 (*)    All other components within normal limits  URINALYSIS, ROUTINE W REFLEX MICROSCOPIC - Abnormal; Notable for the following components:   Color, Urine AMBER (*)    Specific Gravity, Urine 1.033 (*)    Bilirubin Urine SMALL (*)    Protein, ur >=300 (*)    All other components within normal limits  LIPASE, BLOOD  TROPONIN I (HIGH SENSITIVITY)    EKG EKG Interpretation Date/Time:  Wednesday January 20 2024 08:02:34 EDT Ventricular Rate:  98 PR Interval:  167 QRS Duration:  87 QT Interval:  348 QTC Calculation: 445 R Axis:   221  Text Interpretation: Sinus rhythm Probable left atrial enlargement Right axis deviation Borderline T wave abnormalities No significant change since last tracing Confirmed by Elayne Snare ([id number]) on 01/20/2024 8:28:53 AM  Radiology US Abdomen Limited RUQ (LIVER/GB) Result Date: 01/20/2024 CLINICAL DATA:  Elevated bilirubin EXAM: ULTRASOUND ABDOMEN LIMITED RIGHT UPPER QUADRANT COMPARISON:  None Available. FINDINGS: Gallbladder: No gallstones or wall thickening visualized. No sonographic Murphy sign noted by sonographer. Common bile duct: Diameter: 2.4 mm Liver: Increased echogenicity. No focal lesion. Portal vein is patent on color Doppler imaging with normal direction of blood flow towards the liver. Other: None. IMPRESSION: 1.  Increased hepatic parenchymal echogenicity suggestive of steatosis. 2. No cholelithiasis or sonographic evidence for acute cholecystitis. Electronically Signed   By: Annia Belt M.D.   On: 01/20/2024 09:56    Procedures Procedures    Medications Ordered in ED Medications  acetaminophen (TYLENOL) tablet 650 mg (650 mg Oral Given 01/20/24 0809)  ondansetron (ZOFRAN) injection 4 mg (4 mg Intravenous Given 01/20/24 0810)  famotidine (PEPCID) IVPB 20 mg premix (0 mg Intravenous Stopped 01/20/24 0906)  lactated ringers bolus 1,000 mL (0 mLs Intravenous Stopped 01/20/24 1036)  ibuprofen (ADVIL) tablet 400 mg (400 mg Oral Given 01/20/24 9562)    ED Course/ Medical Decision Making/ A&P Clinical Course as of 01/20/24 1039  Wed Jan 20, 2024  0910 Bili increased from baseline, mildly increased LFTs. Will order RUQ [place]. [VK]  1000 Hepatic steatosis, otherwise no acute abnormality on RUQ [place].  [VK]  1037 CXR read pending but no acute abnormality on my read. Patient is comfortable with discharge home, will be called if x-ray is abnormal. Recommended close PCP follow up and given strict return precautions. [VK]    Clinical Course User Index [VK] Rexford Maus, DO                                 Medical Decision Making This patient presents to the ED with chief complaint(s) of nausea, cough with pertinent past medical history of DM which further complicates the presenting complaint. The complaint involves an extensive differential diagnosis and also carries with it a high risk of complications and morbidity.    The differential diagnosis includes ACS, arrhythmia, anemia, pneumonia, pneumothorax, pulmonary edema, pleural effusion, gastritis, GERD, pancreatitis, hepatitis, viral syndrome, hyperglycemic crisis  Additional history obtained: Additional history obtained from family Records reviewed Care Everywhere/External Records  ED Course and Reassessment: On patient's arrival he is mildly  tachycardic and otherwise hemodynamically stable in no acute distress.  Patient will have EKG and labs including troponin, LFTs and lipase, will viral swab and chest x-ray to evaluate for cause of his symptoms.  Will be started on fluids and will be given Zofran, Tylenol and Pepcid for symptomatic management and will be closely reassessed.  Independent labs interpretation:  The following labs were independently interpreted: influenza A positive, increased bili from baseline  Independent visualization of imaging: - I independently visualized the following imaging with scope of interpretation limited to determining acute life threatening conditions related to emergency care: CXR, RUQ [place], which revealed no acute disease; fatty liver  Consultation: - Consulted or discussed management/test interpretation w/ external professional: N/A  Consideration for admission or further workup: Patient has no emergent conditions requiring admission or further work-up at this time and is stable for discharge home with primary care follow-up  Social Determinants of health: N/A    Amount and/or Complexity of Data Reviewed Labs: ordered. Radiology: ordered.  Risk OTC drugs. Prescription drug management.          Final Clinical Impression(s) / ED Diagnoses Final diagnoses:  Influenza A  Elevated bilirubin  Hepatic steatosis    Rx / DC Orders ED Discharge Orders          Ordered    ondansetron (ZOFRAN) 4 MG tablet  Every 6 hours        01/20/24 1039    benzonatate (TESSALON) 100 MG capsule  Every 8 hours        01/20/24 1039              [place], Bethany K, DO 01/20/24 1040
